# Patient Record
Sex: Female | Born: 1970 | Race: White | Hispanic: No | Marital: Married | Smoking: Never smoker
Health system: Southern US, Community
[De-identification: ages and names within clinical notes are randomized; demographics above are authoritative.]

## PROBLEM LIST (undated history)

## (undated) DIAGNOSIS — E785 Hyperlipidemia, unspecified: Secondary | ICD-10-CM

## (undated) DIAGNOSIS — C801 Malignant (primary) neoplasm, unspecified: Secondary | ICD-10-CM

## (undated) DIAGNOSIS — I10 Essential (primary) hypertension: Secondary | ICD-10-CM

## (undated) DIAGNOSIS — Z800 Family history of malignant neoplasm of digestive organs: Secondary | ICD-10-CM

## (undated) DIAGNOSIS — Z8051 Family history of malignant neoplasm of kidney: Secondary | ICD-10-CM

## (undated) DIAGNOSIS — Z8042 Family history of malignant neoplasm of prostate: Secondary | ICD-10-CM

## (undated) DIAGNOSIS — Z923 Personal history of irradiation: Secondary | ICD-10-CM

## (undated) DIAGNOSIS — K219 Gastro-esophageal reflux disease without esophagitis: Secondary | ICD-10-CM

## (undated) HISTORY — DX: Essential (primary) hypertension: I10

## (undated) HISTORY — DX: Family history of malignant neoplasm of digestive organs: Z80.0

## (undated) HISTORY — DX: Hyperlipidemia, unspecified: E78.5

## (undated) HISTORY — DX: Gastro-esophageal reflux disease without esophagitis: K21.9

## (undated) HISTORY — PX: BREAST SURGERY: SHX581

## (undated) HISTORY — DX: Family history of malignant neoplasm of prostate: Z80.42

## (undated) HISTORY — DX: Family history of malignant neoplasm of kidney: Z80.51

## (undated) HISTORY — DX: Malignant (primary) neoplasm, unspecified: C80.1

---

## 2010-11-29 DIAGNOSIS — C801 Malignant (primary) neoplasm, unspecified: Secondary | ICD-10-CM

## 2010-11-29 DIAGNOSIS — Z923 Personal history of irradiation: Secondary | ICD-10-CM

## 2010-11-29 HISTORY — DX: Malignant (primary) neoplasm, unspecified: C80.1

## 2010-11-29 HISTORY — DX: Personal history of irradiation: Z92.3

## 2011-08-04 ENCOUNTER — Other Ambulatory Visit: Payer: Self-pay | Admitting: Specialist

## 2011-08-04 DIAGNOSIS — R921 Mammographic calcification found on diagnostic imaging of breast: Secondary | ICD-10-CM

## 2011-08-11 ENCOUNTER — Ambulatory Visit
Admission: RE | Admit: 2011-08-11 | Discharge: 2011-08-11 | Disposition: A | Discharge status: Home / Self Care | Payer: BC Managed Care – PPO | Source: Ambulatory Visit | Attending: Specialist | Admitting: Specialist

## 2011-08-11 DIAGNOSIS — R921 Mammographic calcification found on diagnostic imaging of breast: Secondary | ICD-10-CM

## 2011-08-12 ENCOUNTER — Other Ambulatory Visit: Payer: Self-pay | Admitting: Specialist

## 2011-08-12 DIAGNOSIS — C50911 Malignant neoplasm of unspecified site of right female breast: Secondary | ICD-10-CM

## 2011-08-13 HISTORY — PX: BREAST BIOPSY: SHX20

## 2011-08-18 ENCOUNTER — Encounter (INDEPENDENT_AMBULATORY_CARE_PROVIDER_SITE_OTHER): Payer: Self-pay | Admitting: General Surgery

## 2011-08-18 ENCOUNTER — Ambulatory Visit (HOSPITAL_BASED_OUTPATIENT_CLINIC_OR_DEPARTMENT_OTHER): Payer: BC Managed Care – PPO | Admitting: General Surgery

## 2011-08-18 ENCOUNTER — Other Ambulatory Visit: Payer: Self-pay | Admitting: Oncology

## 2011-08-18 ENCOUNTER — Ambulatory Visit: Payer: BC Managed Care – PPO | Admitting: Physical Therapy

## 2011-08-18 ENCOUNTER — Encounter (HOSPITAL_BASED_OUTPATIENT_CLINIC_OR_DEPARTMENT_OTHER): Payer: BC Managed Care – PPO | Admitting: Oncology

## 2011-08-18 ENCOUNTER — Ambulatory Visit (INDEPENDENT_AMBULATORY_CARE_PROVIDER_SITE_OTHER): Payer: BC Managed Care – PPO | Admitting: General Surgery

## 2011-08-18 ENCOUNTER — Ambulatory Visit
Admission: RE | Admit: 2011-08-18 | Discharge: 2011-08-18 | Disposition: A | Discharge status: Home / Self Care | Payer: BC Managed Care – PPO | Source: Ambulatory Visit | Attending: Specialist | Admitting: Specialist

## 2011-08-18 ENCOUNTER — Encounter: Payer: BC Managed Care – PPO | Admitting: Oncology

## 2011-08-18 VITALS — BP 134/96 | HR 74 | Temp 98.6°F | Resp 20 | Ht 59.5 in | Wt 126.6 lb

## 2011-08-18 DIAGNOSIS — D0510 Intraductal carcinoma in situ of unspecified breast: Secondary | ICD-10-CM | POA: Insufficient documentation

## 2011-08-18 DIAGNOSIS — D0590 Unspecified type of carcinoma in situ of unspecified breast: Secondary | ICD-10-CM

## 2011-08-18 DIAGNOSIS — C50911 Malignant neoplasm of unspecified site of right female breast: Secondary | ICD-10-CM

## 2011-08-18 LAB — CBC WITH DIFFERENTIAL/PLATELET
Basophils Absolute: 0 10*3/uL (ref 0.0–0.1)
EOS%: 0.4 % (ref 0.0–7.0)
Eosinophils Absolute: 0 10*3/uL (ref 0.0–0.5)
HCT: 43 % (ref 34.8–46.6)
HGB: 14.5 g/dL (ref 11.6–15.9)
LYMPH%: 25.1 % (ref 14.0–49.7)
MCH: 30.7 pg (ref 25.1–34.0)
MCV: 90.9 fL (ref 79.5–101.0)
MONO%: 4.4 % (ref 0.0–14.0)
NEUT%: 70 % (ref 38.4–76.8)
Platelets: 235 10*3/uL (ref 145–400)

## 2011-08-18 LAB — COMPREHENSIVE METABOLIC PANEL
AST: 12 U/L (ref 0–37)
Alkaline Phosphatase: 63 U/L (ref 39–117)
BUN: 9 mg/dL (ref 6–23)
Creatinine, Ser: 0.8 mg/dL (ref 0.50–1.10)
Glucose, Bld: 96 mg/dL (ref 70–99)

## 2011-08-18 MED ORDER — GADOBENATE DIMEGLUMINE 529 MG/ML IV SOLN
12.0000 mL | Freq: Once | INTRAVENOUS | Status: AC | PRN
  Administered 2011-08-18: 12 mL via INTRAVENOUS

## 2011-08-18 NOTE — Patient Instructions (Signed)
IF YOU ARE TAKING ASPIRIN, COUMADIN/WARFARIN, PLAVIX, OR OTHER BLOOD THINNER, PLEASE LET US KNOW IMMEDIATELY.  WE WILL NEED TO DISCUSS WITH THE PRESCRIBING PROVIDER IF THESE ARE SAFE TO STOP. IF THESE ARE NOT STOPPED AT THE APPROPRIATE TIME, THIS WILL RESULT IN A DELAY FOR YOUR SURGERY.  DO NOT TAKE THESE MEDICATIONS OR IBUPROFEN/NAPROXEN WITHIN A WEEK BEFORE SURGERY.   The main risks of surgery are bleeding, infection, damage to other structures, and seroma (accumulation of fluid) under the incision site(s).    These complications may lead to additional procedures such as drainage of seroma/infection.  If cancer is found, you may need other surgeries to obtain negative margins or to take more lymph nodes.   Most women do accumulate fluid in the breast cavity where the specimen was removed. We do not always have to drain this fluid.  If your breast is very tense, painful, or red, then we may need to numb the skin and use a needle to aspirate the fluid.  We do provide patients with a Breast Binder.  The purpose of this is to avoid the use of tape on the sensitive tissue of the breast and to provide some compression to minimize the risk of seroma.  If the binder is uncomfortable, you may find that a tank top with a built-in shelf bra or a loose sports bra works better for you.  I recommend wearing this around the clock for the first 1-2 weeks except in the shower.    You may remove your dressings and may shower 48 hours after surgery IF YOU DO NOT HAVE A DRAIN.    Many patients have some constipation in the week after surgery due to the narcotics and anesthesia.  You may need over the counter stool softeners or laxatives if you experience difficulty having bowel movements.    If the following occur, call our office at [phone]: If you have a fever over 101 or pain that is severe despite narcotics. If you have redness or drainage at the wound. If you develop persistent nausea or vomiting.  I  will follow you back up in 1-4 weeks.    Please submit any paperwork about time off work/insurance forms to the front desk.   

## 2011-08-18 NOTE — Progress Notes (Signed)
Chief Complaint  Patient presents with  . Breast Cancer    HISTORY: Phyllis Hoffman is a 40 year old pt who presented with a screening detected breast mass on the R.  This is biopsy proven DCIS.  MRI is negative for other lesions.  She has no family history of breast cancer or ovarian cancer.  She has not ever had a breast biopsy. She has had irregular periods for many years until last year.  Onset of menses was age 70.  She cannot feel the abnormality.  She is quite anxious about the diagnosis.  She has had two normal mammograms since age 79.    Past Medical History  Diagnosis Date  . GERD (gastroesophageal reflux disease)   . Hypertension   . Hyperlipidemia     Past Surgical History  Procedure Date  . Cesarean section     Current Outpatient Prescriptions  Medication Sig Dispense Refill  . acidophilus (RISAQUAD) CAPS Take by mouth daily.        . fish oil-omega-3 fatty acids 1000 MG capsule Take 2 g by mouth daily.        Marland Kitchen glucosamine-chondroitin 500-400 MG tablet Take 1 tablet by mouth 3 (three) times daily.        . psyllium (REGULOID) 0.52 G capsule Take 0.52 g by mouth daily.         No current facility-administered medications for this visit.   Facility-Administered Medications Ordered in Other Visits  Medication Dose Route Frequency Provider Last Rate Last Dose  . gadobenate dimeglumine (MULTIHANCE) injection 12 mL  12 mL Intravenous Once PRN Medication Radiologist   12 mL at 08/18/11 0834     Allergies  Allergen Reactions  . Erythromycin   . Penicillins      Family History  Problem Relation Age of Onset  . Cancer Father     History   Social History  . Marital Status: Married    Spouse Name: N/A    Number of Children: N/A  . Years of Education: N/A   Occupational History  . works in [employer] trader    Social History Main Topics  . Smoking status: Never Smoker   . Smokeless tobacco: Not on file  . Alcohol Use: No  . Drug Use: Not on file  .  Sexually Active: Not on file   Other Topics Concern  . Not on file   Social History Narrative   Charles- spouseCaleb- 14 sonGrace 9 - daughter     REVIEW OF SYSTEMS - PERTINENT POSITIVES ONLY: 12 point review of systems negative other than HPI and PMH except for deliberate weigh loss, difficulty sleeping, and poor appetite.    EXAM: Filed Vitals:   08/18/11 1401  BP: 134/96  Pulse: 74  Temp: 98.6 F (37 C)  Resp: 20    Gen:  No acute distress.  Well nourished and well groomed.   Neurological: Alert and oriented to person, place, and time. Coordination normal.  Head: Normocephalic and atraumatic.  Eyes: Conjunctivae are normal. Pupils are equal, round, and reactive to light. No scleral icterus.  Neck: Normal range of motion. Neck supple. No tracheal deviation or thyromegaly present.  Cardiovascular: Normal rate, regular rhythm, normal heart sounds and intact distal pulses.  Exam reveals no gallop and no friction rub.  No murmur heard. Respiratory: Effort normal.  No respiratory distress. No chest wall tenderness. Breath sounds normal.  No wheezes, rales or rhonchi.  Breast:  Symmetric bilaterally.  No palpable masses or axillary  adenopathy.  Bruising over R lower inner quadrant of breast from biopsy.   GI: Soft. Bowel sounds are normal. The abdomen is soft and nontender.  There is no rebound and no guarding.  Musculoskeletal: Normal range of motion. Extremities are nontender.  Lymphadenopathy: No cervical, preauricular, postauricular or axillary adenopathy is present Skin: Skin is warm and dry. No rash noted. No diaphoresis. No erythema. No pallor. No clubbing, cyanosis, or edema.   Psychiatric: Normal mood and affect. Behavior is normal. Judgment and thought content normal.    LABORATORY RESULTS: Available labs are reviewed, cbc OK   RADIOLOGY RESULTS: See E-Chart for most recent results.  Images and reports are reviewed.  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 Would do lumpectomy with radiation in BRCA neg or if pt elects for breast conservation. Had long discussion regarding options. If pt elects to have genetic testing, she may elect to wait for results and have bilateral mastectomies with reconstruction.    We reviewed lumpectomy without lymph node biopsy should she have negative genetics or elect to proceed without testing.     Pt to proceed with lumpectomy now and plan any further intervention in the future.     Maudry Diego MD Surgical Oncology, General and Endocrine Surgery [facility]      Visit Diagnoses: 1. Ductal carcinoma in situ of breast   2. 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

## 2011-08-18 NOTE — Assessment & Plan Note (Addendum)
Plan genetic counseling Friday 9/21. Would do lumpectomy with radiation in BRCA neg or if pt elects for breast conservation. Had long discussion regarding options. If pt elects to have genetic testing, she may elect to wait for results and have bilateral mastectomies with reconstruction.    We reviewed lumpectomy without lymph node biopsy should she have negative genetics or elect to proceed without testing.

## 2011-08-19 ENCOUNTER — Other Ambulatory Visit (INDEPENDENT_AMBULATORY_CARE_PROVIDER_SITE_OTHER): Payer: Self-pay | Admitting: General Surgery

## 2011-08-19 DIAGNOSIS — C50919 Malignant neoplasm of unspecified site of unspecified female breast: Secondary | ICD-10-CM

## 2011-08-20 ENCOUNTER — Encounter: Payer: BC Managed Care – PPO | Admitting: Genetic Counselor

## 2011-08-24 ENCOUNTER — Encounter (HOSPITAL_COMMUNITY)
Admission: RE | Admit: 2011-08-24 | Discharge: 2011-08-24 | Disposition: A | Discharge status: Home / Self Care | Payer: BC Managed Care – PPO | Source: Ambulatory Visit | Attending: General Surgery | Admitting: General Surgery

## 2011-08-24 ENCOUNTER — Other Ambulatory Visit (INDEPENDENT_AMBULATORY_CARE_PROVIDER_SITE_OTHER): Payer: Self-pay | Admitting: General Surgery

## 2011-08-24 DIAGNOSIS — D0590 Unspecified type of carcinoma in situ of unspecified breast: Secondary | ICD-10-CM

## 2011-08-24 LAB — CBC
HCT: 40.7 % (ref 36.0–46.0)
Hemoglobin: 13.7 g/dL (ref 12.0–15.0)
MCH: 31 pg (ref 26.0–34.0)
MCHC: 33.7 g/dL (ref 30.0–36.0)
RBC: 4.42 MIL/uL (ref 3.87–5.11)

## 2011-08-24 LAB — BASIC METABOLIC PANEL
BUN: 10 mg/dL (ref 6–23)
CO2: 29 mEq/L (ref 19–32)
GFR calc non Af Amer: >60 mL/min (ref >60)
Glucose, Bld: 111 mg/dL — ABNORMAL HIGH (ref 70–99)
Potassium: 3.9 mEq/L (ref 3.5–5.1)
Sodium: 138 mEq/L (ref 135–145)

## 2011-08-24 LAB — SURGICAL PCR SCREEN
MRSA, PCR: NEGATIVE
Staphylococcus aureus: NEGATIVE

## 2011-08-25 ENCOUNTER — Ambulatory Visit
Admission: RE | Admit: 2011-08-25 | Discharge: 2011-08-25 | Disposition: A | Discharge status: Home / Self Care | Payer: BC Managed Care – PPO | Source: Ambulatory Visit | Attending: General Surgery | Admitting: General Surgery

## 2011-08-25 ENCOUNTER — Other Ambulatory Visit (INDEPENDENT_AMBULATORY_CARE_PROVIDER_SITE_OTHER): Payer: Self-pay | Admitting: General Surgery

## 2011-08-25 ENCOUNTER — Ambulatory Visit (HOSPITAL_COMMUNITY)
Admission: RE | Admit: 2011-08-25 | Discharge: 2011-08-25 | Disposition: A | Discharge status: Home / Self Care | Payer: BC Managed Care – PPO | Source: Ambulatory Visit | Attending: General Surgery | Admitting: General Surgery

## 2011-08-25 DIAGNOSIS — C50919 Malignant neoplasm of unspecified site of unspecified female breast: Secondary | ICD-10-CM

## 2011-08-25 DIAGNOSIS — Z01810 Encounter for preprocedural cardiovascular examination: Secondary | ICD-10-CM | POA: Insufficient documentation

## 2011-08-25 DIAGNOSIS — D0590 Unspecified type of carcinoma in situ of unspecified breast: Secondary | ICD-10-CM | POA: Insufficient documentation

## 2011-08-25 DIAGNOSIS — Z01818 Encounter for other preprocedural examination: Secondary | ICD-10-CM | POA: Insufficient documentation

## 2011-08-25 DIAGNOSIS — Z01812 Encounter for preprocedural laboratory examination: Secondary | ICD-10-CM | POA: Insufficient documentation

## 2011-08-25 HISTORY — PX: BREAST LUMPECTOMY: SHX2

## 2011-09-03 ENCOUNTER — Telehealth (INDEPENDENT_AMBULATORY_CARE_PROVIDER_SITE_OTHER): Payer: Self-pay | Admitting: General Surgery

## 2011-09-03 NOTE — Telephone Encounter (Signed)
Pt was advised that her medial margin was close and that the concensus of the multidisciplinary conference was to re-excise that margin. She is amenable and we will try to schedule next week.

## 2011-09-09 ENCOUNTER — Ambulatory Visit (HOSPITAL_BASED_OUTPATIENT_CLINIC_OR_DEPARTMENT_OTHER)
Admission: RE | Admit: 2011-09-09 | Discharge: 2011-09-09 | Disposition: A | Discharge status: Home / Self Care | Payer: BC Managed Care – PPO | Source: Ambulatory Visit | Attending: General Surgery | Admitting: General Surgery

## 2011-09-09 ENCOUNTER — Other Ambulatory Visit (INDEPENDENT_AMBULATORY_CARE_PROVIDER_SITE_OTHER): Payer: Self-pay | Admitting: General Surgery

## 2011-09-09 DIAGNOSIS — D0590 Unspecified type of carcinoma in situ of unspecified breast: Secondary | ICD-10-CM | POA: Insufficient documentation

## 2011-09-10 NOTE — Progress Notes (Signed)
Quick Note:  Please advise patient that re-excision margins are negative and that NO additional DCIS was found on the specimen. ______

## 2011-09-14 NOTE — Op Note (Signed)
  NAME:  REXANNA, LOUTHAN              ACCOUNT NO.:  [id number]  MEDICAL RECORD NO.:  [id number]  LOCATION:                                 FACILITY:  PHYSICIAN:  Almond Lint, MD       DATE OF BIRTH:  Jan 13, 1971  DATE OF PROCEDURE:  09/09/2011 DATE OF DISCHARGE:                              OPERATIVE REPORT   PREOPERATIVE DIAGNOSIS:  Right breast ductal carcinoma in situ.  POSTOPERATIVE DIAGNOSIS:  Right breast ductal carcinoma in situ.  PROCEDURE:  Re-excision of right breast medial margin for ductal carcinoma in situ.  SURGEON:  Almond Lint, MD  ASSISTANT:  None.  ANESTHESIA:  General and local.  FINDINGS:  Seroma cavity.  SPECIMEN:  Medial margin to pathology.  EBL:  Minimal.  COMPLICATIONS:  None known.  PROCEDURE:  Ms. Vastine was identified in the holding area and taken to the operating room, where she was placed supine on the operating room table.  General anesthesia was induced.  Her right breast was prepped and draped in sterile fashion.  Time-out was performed according to the surgical safety check list.  When all was correct, we continued.  The prior incision along the medial circumareolar border was opened back up. The seroma cavity drained out.  The medial margin was identified and elevated with 2 Allis clamps.  This was taken with the curved Mayos and disk around 5 mm stick and around the size of a 50 cent piece in diameter.  The specimen was marked short superior, long lateral.  The cavity was inspected for hemostasis which was achieved with the cautery. The breast was irrigated.  The skin was reapproximated with 3-0 Vicryl deep dermal sutures and 4-0 Monocryl running subcuticular sutures.  The wound was then cleaned, dried and dressed with Steri-Strips, gauze and a breast binder.  The patient was awakened from anesthesia and taken to the PACU in stable condition.  Needle, sponge, instrument counts were correct.     Almond Lint,  MD     FB/MEDQ  D:  09/09/2011  T:  09/09/2011  Job:  [id number]  Electronically Signed by Almond Lint MD on 09/14/2011 12:16:32 PM

## 2011-09-14 NOTE — Op Note (Signed)
  NAME:  AKSHITHA, CULMER NO.:  [id number]  MEDICAL RECORD NO.:  [id number]  LOCATION:  [facility]                         FACILITY:  [facility]  PHYSICIAN:  Almond Lint, MD       DATE OF BIRTH:  22-May-1971  DATE OF PROCEDURE:  08/25/2011 DATE OF DISCHARGE:                              OPERATIVE REPORT   PREOPERATIVE DIAGNOSIS:  Right breast ductal carcinoma in situ.  POSTOPERATIVE DIAGNOSIS:  Right breast ductal carcinoma in situ.  PROCEDURE:  Right breast needle-localized lumpectomy.  SURGEON:  Almond Lint, MD  ASSISTANT:  None.  ANESTHESIA:  General and local.  FINDINGS:  Clip in the specimen.  SPECIMEN:  Right breast lumpectomy to Pathology.  ESTIMATED BLOOD LOSS:  Minimal.  COMPLICATIONS:  None known.  PROCEDURE:  Ms. Villaflor was identified in the holding area and taken to operating room where she was placed supine on the operating room table. The wire mammograms were placed on the light blocks.  After her chestwas prepped and draped in sterile fashion, time-out was performed according to surgical safety check list.  When all was correct, we continued.   The wires were palpated under the breast and a circumareolar incision was made along the upper inner quadrant of the right breast. Skin hooks were used to elevate the skin and the cautery was used to take a flap on this side.  The wires were then identified with hemostats and pulled underneath the skin.  The breast tissue was elevated with 2 Allises.  The breast specimen was taken with a curved Mayo's.  One of the wires pulled out with retraction.  Once the specimen was completely excised, the breast cavity was packed with a laparotomy sponge.  The specimen was then marked on all sides of the margin marker paint kit. This was passed off the table and a specimen radiograph was performed demonstrating a clip and the wire was still in the specimen.  Tissue of interest was confirmed to be present with  Radiology.  The breast cavity was then examined and hemostasis achieved with cautery.  The clips were then placed on the medial, lateral, inferior, and superior.  Large clips were placed on the borders of the biopsy cavity.  Two large clips were placed posteriorly.  The wound was then closed with 3-0 Vicryl interrupted deep dermal sutures and 4-0 Monocryl running subcuticular sutures.  The wound was then cleaned, dried, and dressed with Steri- Strips, gauze, ABDs, and a breast binder.  The patient was awoke from anesthesia and taken to PACU in stable condition.  Needle, sponge, and instrument counts were correct x2.     Almond Lint, MD     FB/MEDQ  D:  08/25/2011  T:  08/25/2011  Job:  [id number]  Electronically Signed by Almond Lint MD on 09/14/2011 12:16:11 PM

## 2011-09-17 ENCOUNTER — Telehealth (INDEPENDENT_AMBULATORY_CARE_PROVIDER_SITE_OTHER): Payer: Self-pay

## 2011-09-17 NOTE — Telephone Encounter (Signed)
Pt called for her pathology results which I gave her.

## 2011-09-24 ENCOUNTER — Encounter (INDEPENDENT_AMBULATORY_CARE_PROVIDER_SITE_OTHER): Payer: Self-pay | Admitting: General Surgery

## 2011-09-24 ENCOUNTER — Ambulatory Visit (INDEPENDENT_AMBULATORY_CARE_PROVIDER_SITE_OTHER): Payer: BC Managed Care – PPO | Admitting: General Surgery

## 2011-09-24 VITALS — BP 128/64 | HR 68 | Temp 97.4°F | Resp 14 | Ht 59.5 in | Wt 124.0 lb

## 2011-09-24 DIAGNOSIS — D0590 Unspecified type of carcinoma in situ of unspecified breast: Secondary | ICD-10-CM

## 2011-09-24 DIAGNOSIS — D0510 Intraductal carcinoma in situ of unspecified breast: Secondary | ICD-10-CM

## 2011-09-24 NOTE — Assessment & Plan Note (Signed)
S/p re-excision. Margins widely negative. Pain resolved. Small hematoma should soften up. Follow up with me in 6 months. Has appointments with Dr. Darnelle Catalan and Dr. Mitzi Hansen scheduled.

## 2011-09-24 NOTE — Progress Notes (Signed)
HISTORY: Pt with more soreness initially after re-excision.  Now improved.  She is back at work.  She is having radiation in [place].  She denies other problems.     EXAM: Head: Normocephalic and atraumatic.  Eyes:  Conjunctivae are normal. Pupils are equal, round, and reactive to light. No scleral icterus.  Neck:  Normal range of motion. Neck supple. No tracheal deviation present. No thyromegaly present.  Resp: No respiratory distress, normal effort. Abd:  Abdomen is soft, non distended and non tender. No masses are palpable.  There is no rebound and no guarding.  Neurological: Alert and oriented to person, place, and time. Coordination normal.  Skin: Skin is warm and dry. No rash noted. No diaphoretic. No erythema. No pallor.  Psychiatric: Normal mood and affect. Normal behavior. Judgment and thought content normal.      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 Margins widely negative. Pain resolved. Small hematoma should soften up. Follow up with me in 6 months. Has appointments with Dr. Darnelle Catalan and Dr. Mitzi Hansen scheduled.      Maudry Diego, MD Surgical Oncology, General & Endocrine Surgery [facility]  Jeannett Senior, MD Jeannett Senior, MD

## 2011-09-24 NOTE — Patient Instructions (Signed)
Call if any concerns arise.  

## 2011-09-28 ENCOUNTER — Encounter (INDEPENDENT_AMBULATORY_CARE_PROVIDER_SITE_OTHER): Payer: Self-pay | Admitting: General Surgery

## 2011-10-04 ENCOUNTER — Telehealth (INDEPENDENT_AMBULATORY_CARE_PROVIDER_SITE_OTHER): Payer: Self-pay | Admitting: General Surgery

## 2011-10-04 NOTE — Telephone Encounter (Signed)
Rec'd call from Ty Hilts at [facility]) requested notes from re-excision of right breast. Fax # [phone]. Call back number [phone] option #6 to reach Clydie Braun directly.

## 2011-11-10 ENCOUNTER — Ambulatory Visit (HOSPITAL_BASED_OUTPATIENT_CLINIC_OR_DEPARTMENT_OTHER): Payer: BC Managed Care – PPO | Admitting: Oncology

## 2011-11-10 ENCOUNTER — Other Ambulatory Visit: Payer: Self-pay | Admitting: Oncology

## 2011-11-10 ENCOUNTER — Other Ambulatory Visit (HOSPITAL_BASED_OUTPATIENT_CLINIC_OR_DEPARTMENT_OTHER): Payer: BC Managed Care – PPO | Admitting: Lab

## 2011-11-10 ENCOUNTER — Telehealth: Payer: Self-pay | Admitting: *Deleted

## 2011-11-10 ENCOUNTER — Encounter: Payer: Self-pay | Admitting: Oncology

## 2011-11-10 VITALS — BP 134/89 | HR 98 | Temp 98.3°F | Ht 59.5 in | Wt 125.8 lb

## 2011-11-10 DIAGNOSIS — D0590 Unspecified type of carcinoma in situ of unspecified breast: Secondary | ICD-10-CM

## 2011-11-10 DIAGNOSIS — C50919 Malignant neoplasm of unspecified site of unspecified female breast: Secondary | ICD-10-CM

## 2011-11-10 LAB — CBC WITH DIFFERENTIAL/PLATELET
Basophils Absolute: 0 10*3/uL (ref 0.0–0.1)
Eosinophils Absolute: 0 10*3/uL (ref 0.0–0.5)
HGB: 14.1 g/dL (ref 11.6–15.9)
MCV: 92.5 fL (ref 79.5–101.0)
MONO#: 0.3 10*3/uL (ref 0.1–0.9)
NEUT#: 4.7 10*3/uL (ref 1.5–6.5)
RBC: 4.49 10*6/uL (ref 3.70–5.45)
RDW: 12.8 % (ref 11.2–14.5)
WBC: 6.1 10*3/uL (ref 3.9–10.3)
lymph#: 1 10*3/uL (ref 0.9–3.3)

## 2011-11-10 MED ORDER — TAMOXIFEN CITRATE 20 MG PO TABS: 20.0000 mg | ORAL_TABLET | Freq: Every day | ORAL | Status: DC

## 2011-11-10 NOTE — Telephone Encounter (Signed)
gave patient appointment for 12-29-2011 starting at 10:00am printed out calendar and gave to the patient

## 2011-11-10 NOTE — Patient Instructions (Signed)
Begin tamoxifen, 20 mg once daily, with 1st dose on the day after you complete radiation therapy.  Return in approx. 6 weeks for follow up visit.  Call with any questions [phone]

## 2011-11-10 NOTE — Progress Notes (Signed)
Hematology and Oncology Follow Up Visit  Phyllis Hoffman [id number] 10-27-1971 40 y.o. 11/10/2011    HPI: Phyllis Hoffman is a [place] woman, referred to [place] in September 2012 at the age of 33 for evaluation of right breast carcinoma.  Bilateral digital mammogram in [place] in August of 2011 showed a focus of microcalcification in the right breast and 6 month followup was recommended. This was performed in February 2012, the calcifications appearing stable as compared to prior study. The patient was recalled for repeat mammography in 6 months to resume her yearly schedule. On 07/29/2011, the calcifications were again noted, some of them appearing more irregular, however, and with previous studies. Left breast was unremarkable.  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 Tumor was ER and PR positive, both at 100%.  Bilateral breast MRIs were obtained 08/18/2011 showing a post biopsy hematoma in the medial right breast. There was a vague patchy no masslike enhancement in the medial right breast near the biopsy cavity, spanning approximately 2.4 cm. No evidence of multifocal or multicentric disease in the right breast. Left breast and lymph nodes appeared benign.  Patient did underwent reexcision of the right medial margin but subsequent clear margins. Final pathology [id number]) showed a high-grade ductal carcinoma in situ with necrosis and calcification.  Patient underwent radiation therapy in [place], and is scheduled to complete radiation next week. The plan is to treat with tamoxifen, 20 mg daily, for 5 years.  Of note patient is known to be BRCA1 and 2 negative.  Interim History:   Patient returns today for followup of her right breast carcinoma. She has almost completed radiation to the right breast, her last therapy scheduled for next week. She is here to discuss  beginning on tamoxifen. Over half of our 45 minute appointment today was spent discussing the benefits as well as the risks and toxicities associated with tamoxifen.  Overall patient is doing well. She did have multiple questions today. She's tolerated radiation well, with only the expected skin changes. Her energy level is fairly good. She continues to work in [employer]. She continues to have regular menstrual cycles. We did discuss the fact that she would not want it pregnant on the tamoxifen, and of course her husband is status post mastectomy.  A detailed review of systems is otherwise noncontributory as noted below.  Review of Systems: Constitutional:  no weight loss, fever, night sweats and feels well Eyes: negative [id number] Cardiovascular: no chest pain or dyspnea on exertion Respiratory: no cough, shortness of breath, or wheezing Neurological: negative Dermatological: negative Gastrointestinal: no abdominal pain, change in bowel habits, or black or bloody stools Genito-Urinary: no dysuria, trouble voiding, or hematuria Hematological and Lymphatic: negative Breast: positive for - skin changes on right associated with radiation Musculoskeletal: negative Remaining ROS negative.  Family History: Patient's father is alive at age 37, her mother alive at age 62. Her father had prostate cancer diagnosed proximally 7 years ago, had surgery, and his spine. The patient has one brother and no sisters. No breast or ovarian cancer in the family to the best of her knowledge.  Gynecologic History: First menstrual period at age 34, still having regular periods. Patient is GX P2,  first pregnancy to term at age 31. She did use birth control pills for approximately 17 years.  Social History: Patient works in a Futures trader. Her husband Phyllis Hoffman "[employer], trains showing horses  and does quite a bit of traveling. They have 2 children, 1 son, Phyllis Hoffman, age 68 and one daughter,  Phyllis Hoffman, age 71. The patient attends the [employer] in [place].  Medications: I have reviewed the patient's current medications. Current Outpatient Prescriptions  Medication Sig Dispense Refill  . Ascorbic Acid (VITAMIN C) 1000 MG tablet Take 1,000 mg by mouth 3 (three) times daily.        . fish oil-omega-3 fatty acids 1000 MG capsule Take 1 g by mouth daily.       Marland Kitchen glucosamine-chondroitin 500-400 MG tablet Take 1 tablet by mouth 3 (three) times daily.        . Multiple Vitamin (MULTIVITAMIN) tablet Take 1 tablet by mouth daily.        Marland Kitchen OVER THE COUNTER MEDICATION Take 1 tablet by mouth 3 (three) times daily. Immune system builder       . psyllium (REGULOID) 0.52 G capsule Take 0.52 g by mouth daily.        Marland Kitchen acidophilus (RISAQUAD) CAPS Take by mouth daily.        . tamoxifen (NOLVADEX) 20 MG tablet Take 1 tablet (20 mg total) by mouth daily.  30 tablet  6    Allergies:  Allergies  Allergen Reactions  . Erythromycin Nausea And Vomiting  . Penicillins Other (See Comments)    Happened when she was a child, does not remember reaction.    Physical Exam: Filed Vitals:   11/10/11 1037  BP: 134/89  Pulse: 98  Temp: 98.3 F (36.8 C)   HEENT:  Sclerae anicteric, conjunctivae pink.  Oropharynx clear.  No mucositis or candidiasis.   Nodes:  No cervical, supraclavicular, or axillary lymphadenopathy palpated.  Breast Exam:   right breast, status post lumpectomy with well-healed incision. There is erythema, hyperpigmentation, and skin changes associated with radiation. No evidence of local recurrence. Left breast is benign, no masses, discharge, skin change, or nipple inversion. Lungs:  Clear to auscultation bilaterally.  No crackles, rhonchi, or wheezes.   Heart:  Regular rate and rhythm.   Abdomen:  Soft, nontender.  Positive bowel sounds.  No organomegaly or masses palpated.   Musculoskeletal:  No focal spinal tenderness to palpation.  Extremities:  Benign.  No peripheral  edema or cyanosis.   Skin:  Benign.   Neuro:  Nonfocal.   Lab Results: Lab Results  Component Value Date   WBC 6.1 11/10/2011   HGB 14.1 11/10/2011   HCT 41.6 11/10/2011   MCV 92.5 11/10/2011   PLT 208 11/10/2011   NEUTROABS 4.7 11/10/2011     Chemistry      Component Value Date/Time   NA 138 08/24/2011 1405   K 3.9 08/24/2011 1405   CL 101 08/24/2011 1405   CO2 29 08/24/2011 1405   BUN 10 08/24/2011 1405   CREATININE 0.61 08/24/2011 1405      Component Value Date/Time   CALCIUM 9.5 08/24/2011 1405   ALKPHOS 63 08/18/2011 1200   ALKPHOS 63 08/18/2011 1200   AST 12 08/18/2011 1200   AST 12 08/18/2011 1200   ALT 11 08/18/2011 1200   ALT 11 08/18/2011 1200   BILITOT 0.2* 08/18/2011 1200   BILITOT 0.2* 08/18/2011 1200        Radiological Studies:  No results found.   Assessment:  40 year old [place], status post right breast biopsies on 08/11/2011 for ductal carcinoma in situ, intermediate to high grade, strongly ER and PR positive. Underwent subsequent reexcision of the right  medial margin in October 2012. Currently receiving radiation therapy in  [place], and scheduled to complete treatment next week. Ready to initiate tamoxifen, the plan being to continue for total of 5 years, then discontinued followup. Patient is known to be BRCA1 and 2 negative.   Plan:  Overall patient is doing well, and is tolerating radiation with no complications. She'll complete radiation next week, and will begin tamoxifen the following day at 20 mg daily. We did spend over half of our 45 minute appointment today reviewing the benefits as well as the possible toxicities and side effects associated with tamoxifen,, and she was given her prescription today. She does understand to begin this after finishing radiation.  We'll plan on seeing the patient back and proximally 6-8 weeks to assess tolerance. At that point we'll discuss her long-term followup.    This plan was reviewed with the patient, who  voices understanding and agreement.  She knows to call with any changes or problems.    Iviona Hole, PA-C 11/10/2011

## 2011-12-01 ENCOUNTER — Telehealth (INDEPENDENT_AMBULATORY_CARE_PROVIDER_SITE_OTHER): Payer: Self-pay

## 2011-12-01 ENCOUNTER — Telehealth: Payer: Self-pay | Admitting: *Deleted

## 2011-12-01 ENCOUNTER — Encounter (INDEPENDENT_AMBULATORY_CARE_PROVIDER_SITE_OTHER): Payer: Self-pay

## 2011-12-01 NOTE — Telephone Encounter (Signed)
Pt called to request a letter excusing her from upcoming jury duty.  I told her I would ask Dr. Donell Beers and call her back today.

## 2011-12-01 NOTE — Telephone Encounter (Signed)
Message left

## 2011-12-01 NOTE — Telephone Encounter (Signed)
Left message that letter to excuse pt from jury duty would be mailed to her.

## 2011-12-29 ENCOUNTER — Other Ambulatory Visit: Payer: BC Managed Care – PPO | Admitting: Lab

## 2011-12-29 ENCOUNTER — Other Ambulatory Visit: Payer: Self-pay | Admitting: Oncology

## 2011-12-29 ENCOUNTER — Ambulatory Visit (HOSPITAL_BASED_OUTPATIENT_CLINIC_OR_DEPARTMENT_OTHER): Payer: BC Managed Care – PPO | Admitting: Oncology

## 2011-12-29 ENCOUNTER — Telehealth: Payer: Self-pay | Admitting: Oncology

## 2011-12-29 VITALS — BP 126/89 | HR 85 | Temp 98.6°F | Ht 59.5 in | Wt 132.8 lb

## 2011-12-29 DIAGNOSIS — C50919 Malignant neoplasm of unspecified site of unspecified female breast: Secondary | ICD-10-CM

## 2011-12-29 DIAGNOSIS — D0510 Intraductal carcinoma in situ of unspecified breast: Secondary | ICD-10-CM

## 2011-12-29 LAB — CBC WITH DIFFERENTIAL/PLATELET
BASO%: 0.4 % (ref 0.0–2.0)
Basophils Absolute: 0 10*3/uL (ref 0.0–0.1)
Eosinophils Absolute: 0 10*3/uL (ref 0.0–0.5)
HCT: 41 % (ref 34.8–46.6)
HGB: 14.1 g/dL (ref 11.6–15.9)
LYMPH%: 25 % (ref 14.0–49.7)
MCHC: 34.3 g/dL (ref 31.5–36.0)
MONO#: 0.2 10*3/uL (ref 0.1–0.9)
NEUT#: 3 10*3/uL (ref 1.5–6.5)
NEUT%: 69.7 % (ref 38.4–76.8)
Platelets: 191 10*3/uL (ref 145–400)
WBC: 4.3 10*3/uL (ref 3.9–10.3)
lymph#: 1.1 10*3/uL (ref 0.9–3.3)

## 2011-12-29 LAB — COMPREHENSIVE METABOLIC PANEL
ALT: 16 U/L (ref 0–35)
CO2: 28 mEq/L (ref 19–32)
Calcium: 9.4 mg/dL (ref 8.4–10.5)
Chloride: 104 mEq/L (ref 96–112)
Creatinine, Ser: 0.73 mg/dL (ref 0.50–1.10)
Glucose, Bld: 89 mg/dL (ref 70–99)

## 2011-12-29 LAB — CANCER ANTIGEN 27.29: CA 27.29: 18 U/mL (ref 0–39)

## 2011-12-29 NOTE — Progress Notes (Signed)
Hematology and Oncology Follow Up Visit  Phyllis Hoffman [id number] 12-Feb-1971 41 y.o. 12/29/2011    HPI: Phyllis Hoffman is a [place] woman, referred to [place] in September 2012 at the age of 26 for evaluation of right breast carcinoma.  Bilateral digital mammogram in [place] in August of 2011 showed a focus of microcalcification in the right breast and 6 month followup was recommended. This was performed in February 2012, the calcifications appearing stable as compared to prior study. The patient was recalled for repeat mammography in 6 months to resume her yearly schedule. On 07/29/2011, the calcifications were again noted, some of them appearing more irregular, however, and with previous studies. Left breast was unremarkable.  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 Tumor was ER and PR positive, both at 100%.  Bilateral breast MRIs were obtained 08/18/2011 showing a post biopsy hematoma in the medial right breast. There was a vague patchy no masslike enhancement in the medial right breast near the biopsy cavity, spanning approximately 2.4 cm. No evidence of multifocal or multicentric disease in the right breast. Left breast and lymph nodes appeared benign.  Patient did underwent reexcision of the right medial margin but subsequent clear margins. 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  Patient underwent radiation therapy in [place],completed December 2012.At that time she started tamoxifen. Of note patient is known to be BRCA1 and 2 negative.  Interim History:   Phyllis Hoffman returns with her husband Chip for followup of her breast cancer. She did very well with her radiation, working full-time right through the treatments. She did have some dry desquamation, which she treated with Aquaphor. She started tamoxifen  11/20/2011. Incidentally Chip prepared a horse show for the governors in operation earlier this month. He says he went very well.  Review of Systems: She is not having significant hot flashes and in fact she is having regular periods, the most recent one January 19. She has gained a little weight however from 125 to her current 132. She's exercising 6-7 days a week and eats "a good diet" --this was discussed extensively today. A detailed review of systems is otherwise entirely negative.  Family History: Patient's father is alive at age 17, her mother alive at age 19. Her father had prostate cancer diagnosed proximally 7 years ago, had surgery, and "is fine". The patient has one brother and no sisters. No breast or ovarian cancer in the family to the best of her knowledge.  Gynecologic History: First menstrual period at age 73, still having regular periods. Patient is GX P2,  first pregnancy to term at age 42. She did use birth control pills for approximately 17 years. Most recent menstrual period earlier this month as noted above.  Social History: Patient works in a Futures trader. Her husband Leonette Most "[employer], trains showing horses and does quite a bit of traveling. They have 2 children, 1 son, Wilber Oliphant, age 7 and one daughter, Delorise Shiner, age 33. The patient attends the [facility] [facility] [employer] in [place].  Medications: I have reviewed the patient's current medications. Current Outpatient Prescriptions  Medication Sig Dispense Refill  . acidophilus (RISAQUAD) CAPS Take by mouth daily.        . Ascorbic Acid (VITAMIN C) 1000 MG tablet Take 1,000 mg by mouth 3 (three) times daily.        . fish oil-omega-3 fatty acids 1000 MG  capsule Take 1 g by mouth daily.       Marland Kitchen glucosamine-chondroitin 500-400 MG tablet Take 1 tablet by mouth 3 (three) times daily.        . Multiple Vitamin (MULTIVITAMIN) tablet Take 1 tablet by mouth daily.        Marland Kitchen OVER THE COUNTER MEDICATION Take 1 tablet by mouth 3  (three) times daily. Immune system builder       . psyllium (REGULOID) 0.52 G capsule Take 0.52 g by mouth daily.         tamoxifen 20 mg by mouth daily  Allergies:  Allergies  Allergen Reactions  . Erythromycin Nausea And Vomiting  . Penicillins Other (See Comments)    Happened when she was a child, does not remember reaction.    Physical Exam: Filed Vitals:   12/29/11 1020  BP: 126/89  Pulse: 85  Temp: 98.6 F (37 C)   HEENT:  Sclerae anicteric, conjunctivae pink.  Oropharynx clear.  No mucositis or candidiasis.   Nodes:  No cervical, supraclavicular, or axillary lymphadenopathy palpated.  Breast Exam:   right breast, status post lumpectomy with well-healed incision. There is minimal hyperpigmentation. There is some induration as expected beneath the scar. There are no other skin changes and no nipple retraction. Left breast is unremarkable  Lungs:  Clear to auscultation bilaterally.  No crackles, rhonchi, or wheezes.   Heart:  Regular rate and rhythm.   Abdomen:  Soft, nontender.  Positive bowel sounds.  No organomegaly or masses palpated.   Musculoskeletal:  No focal spinal tenderness to palpation.  Extremities:  Benign.  No peripheral edema or cyanosis.   Skin:  Benign.   Neuro:  Nonfocal.   Lab Results: Lab Results  Component Value Date   WBC 4.3 12/29/2011   HGB 14.1 12/29/2011   HCT 41.0 12/29/2011   MCV 92.2 12/29/2011   PLT 191 12/29/2011   NEUTROABS 3.0 12/29/2011     Chemistry      Component Value Date/Time   NA 138 08/24/2011 1405   K 3.9 08/24/2011 1405   CL 101 08/24/2011 1405   CO2 29 08/24/2011 1405   BUN 10 08/24/2011 1405   CREATININE 0.61 08/24/2011 1405      Component Value Date/Time   CALCIUM 9.5 08/24/2011 1405   ALKPHOS 63 08/18/2011 1200   ALKPHOS 63 08/18/2011 1200   AST 12 08/18/2011 1200   AST 12 08/18/2011 1200   ALT 11 08/18/2011 1200   ALT 11 08/18/2011 1200   BILITOT 0.2* 08/18/2011 1200   BILITOT 0.2* 08/18/2011 1200        Radiological  Studies:  No new results found. She will be due for mammography late September/early October.   Assessment:  41 year old [place], [place] woman, BRCA 1-2 negative, status post right breast biopsy on 08/11/2011 for ductal carcinoma in situ, intermediate to high grade, strongly estrogen receptor and progesterone positive, with  subsequent reexcision for margin clearance October 2012.; s/p radiation therapy delivered in  [place] completed December 2012, at which time she started tamoxifen.  Plan:  She is tolerating tamoxifen well. She understands this as a preventive as much as the treatment, and that if it affects quality of life in a negative way of we certainly can discontinue it. At this point the plan is to continue tamoxifen for a total of 5 years. She will let us know if she has any other side effects of concern.  As far as weight gain is concerned, we have  good randomized trials of tamoxifen versus placebo showing that both the tamoxifen and placebo group's gain weight (woman in sig 02 change of life tend to increase other way) but the tamoxifen group did not gain more weight than the placebo group. She is doing terrific as far as exercise is concerned. As far as diet is concerned my recommendation is to cut back on the starches, avoid fried foods, and need as as many vegetables as Splenda fully as she wants.  She is going to have her next mammogram at the breast center October 29 year. She will see me the same day. I plan to see her in a once a year basis until she completes her 5 years of followup. I am not planning to do any lab work since she gets that through her internist in [place] and she sees her gynecologist in June and her internist in February, I think that would be the optimal way to space out her physicians. In any case she knows to call for any problems that may develop before the next visit.  Raheem Kolbe C, PA-C 12/29/2011

## 2011-12-29 NOTE — Telephone Encounter (Signed)
gve the pt her oct 2013 appt calendar along with the mammo appt °

## 2012-03-13 ENCOUNTER — Encounter (INDEPENDENT_AMBULATORY_CARE_PROVIDER_SITE_OTHER): Payer: Self-pay | Admitting: General Surgery

## 2012-03-13 ENCOUNTER — Ambulatory Visit (INDEPENDENT_AMBULATORY_CARE_PROVIDER_SITE_OTHER): Payer: BC Managed Care – PPO | Admitting: General Surgery

## 2012-03-13 VITALS — BP 120/75 | HR 70 | Temp 97.5°F | Resp 18 | Ht 59.5 in | Wt 131.0 lb

## 2012-03-13 DIAGNOSIS — D0590 Unspecified type of carcinoma in situ of unspecified breast: Secondary | ICD-10-CM

## 2012-03-13 DIAGNOSIS — D0510 Intraductal carcinoma in situ of unspecified breast: Secondary | ICD-10-CM

## 2012-03-13 NOTE — Patient Instructions (Signed)
Call if right nipple lesion changes.  Otherwise follow up in 6 months.

## 2012-03-13 NOTE — Progress Notes (Signed)
HISTORY: The patient has been doing well status post right needle localized lumpectomy for DCIS. She is 6 months out. She does complain of skin issues like itching and dryness of her nipple. She also has some tightness in her right axilla when she is lying on her right side. She has not had any issues with tamoxifen. She does complain of a small spot just to the right of her nipple on her areola where it looks like there is ink present. She did not receive any tattoo in this location. She does not have any palpable breast masses.   PERTINENT REVIEW OF SYSTEMS: Otherwise negative.     EXAM: Head: Normocephalic and atraumatic.  Eyes:  Conjunctivae are normal. Pupils are equal, round, and reactive to light. No scleral icterus.  Neck:  Normal range of motion. Neck supple. No tracheal deviation present. No thyromegaly present.  Resp: No respiratory distress, normal effort. Breast:  No masses, skin dimpling, or lymphadenopathy.  Scar is well healed.  There is a slight elevation of the right breast.  The breast size is symmetric.  There is a 2 mm black area that looks like marker ink on the r nipple at the border of the nipple and areola.   Abd:  Abdomen is soft, non distended and non tender. No masses are palpable.  There is no rebound and no guarding.  Neurological: Alert and oriented to person, place, and time. Coordination normal.  Skin: Skin is warm and dry. No rash noted. No diaphoretic. No erythema. No pallor.  Psychiatric: Normal mood and affect. Normal behavior. Judgment and thought content normal.      ASSESSMENT AND PLAN:   DCIS (ductal carcinoma in situ), R breast lower inner quadrant 1 cm, +/+ HR No clinical evidence of disease.   Scheduled for follow up mammogram. Keep eye on nipple lesion.    Follow up with me in 6 months.         Maudry Diego, MD Surgical Oncology, General & Endocrine Surgery [facility]  Jeannett Senior, MD, MD Jeannett Senior,  MD

## 2012-03-13 NOTE — Assessment & Plan Note (Signed)
No clinical evidence of disease.   Scheduled for follow up mammogram. Keep eye on nipple lesion.    Follow up with me in 6 months.

## 2012-06-05 ENCOUNTER — Other Ambulatory Visit: Payer: Self-pay | Admitting: Physician Assistant

## 2012-06-05 DIAGNOSIS — C50219 Malignant neoplasm of upper-inner quadrant of unspecified female breast: Secondary | ICD-10-CM

## 2012-09-26 ENCOUNTER — Telehealth: Payer: Self-pay | Admitting: *Deleted

## 2012-09-26 ENCOUNTER — Ambulatory Visit (HOSPITAL_BASED_OUTPATIENT_CLINIC_OR_DEPARTMENT_OTHER): Payer: BC Managed Care – PPO | Admitting: Oncology

## 2012-09-26 ENCOUNTER — Ambulatory Visit
Admission: RE | Admit: 2012-09-26 | Discharge: 2012-09-26 | Disposition: A | Discharge status: Home / Self Care | Payer: BC Managed Care – PPO | Source: Ambulatory Visit | Attending: Oncology | Admitting: Oncology

## 2012-09-26 ENCOUNTER — Other Ambulatory Visit: Payer: Self-pay | Admitting: Oncology

## 2012-09-26 VITALS — BP 128/89 | HR 79 | Temp 98.3°F | Resp 20 | Ht 59.5 in | Wt 125.8 lb

## 2012-09-26 DIAGNOSIS — Z170 Estrogen receptor positive status [ER+]: Secondary | ICD-10-CM

## 2012-09-26 DIAGNOSIS — D0510 Intraductal carcinoma in situ of unspecified breast: Secondary | ICD-10-CM

## 2012-09-26 DIAGNOSIS — D0590 Unspecified type of carcinoma in situ of unspecified breast: Secondary | ICD-10-CM

## 2012-09-26 MED ORDER — TAMOXIFEN CITRATE 20 MG PO TABS: 20.0000 mg | ORAL_TABLET | Freq: Every day | ORAL | Status: DC

## 2012-09-26 NOTE — Progress Notes (Signed)
Hematology and Oncology Follow Up Visit  Phyllis Hoffman [id number] Oct 23, 1971 41 y.o. 09/26/2012   PCP: Jeannett Senior, MD    HPI: Phyllis Hoffman is a [place] woman, referred to [place] in September 2012 at the age of 29 for evaluation of right breast carcinoma.  Bilateral digital mammogram in [place] in August of 2011 showed a focus of microcalcification in the right breast and 6 month followup was recommended. This was performed in February 2012, the calcifications appearing stable as compared to prior study. The patient was recalled for repeat mammography in 6 months to resume her yearly schedule. On 07/29/2011, the calcifications were again noted, some of them appearing more irregular, however, and with previous studies. Left breast was unremarkable.  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 Tumor was ER and PR positive, both at 100%.  Bilateral breast MRIs were obtained 08/18/2011 showing a post biopsy hematoma in the medial right breast. There was a vague patchy no masslike enhancement in the medial right breast near the biopsy cavity, spanning approximately 2.4 cm. No evidence of multifocal or multicentric disease in the right breast. Left breast and lymph nodes appeared benign.  Patient did underwent reexcision of the right medial margin but subsequent clear margins. 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  Patient underwent radiation therapy in [place],completed December 2012.At that time she started tamoxifen. Of note patient is known to be BRCA1 and 2 negative.  Interim History:   Phyllis Hoffman returns with her husband Chip for followup of her breast cancer. Since her last visit here she started an exercise program with weight training and aerobic since well as walking. She also started a diet consisting  largely of vegetables and fruits. As a result her weight is back to baseline, and in fact slightly below her original baseline. She is deservedly proud of this  Review of Systems: She does not have hot flashes every day. When she does achieve flushes and sweats a little. She is having regular periods, last name approximately 4 days, with only 1 of heavy. Otherwise a detailed review of systems today was entirely negative.  Family History: Patient's father is alive at age 46, her mother alive at age 56. Her father had prostate cancer diagnosed proximally 7 years ago, had surgery, and "is fine". The patient has one brother and no sisters. No breast or ovarian cancer in the family to the best of her knowledge.  Gynecologic History: First menstrual period at age 73, still having regular periods. Patient is GX P2,  first pregnancy to term at age 53. She did use birth control pills for approximately 17 years. Most recent menstrual period earlier this month as noted above.  Social History: Patient works in her Manufacturing engineer. Her husband Leonette Most "[employer], trains showing horses and does quite a bit of traveling. They have 2 children, 1 son, Wilber Oliphant, age 88 and one daughter, Delorise Shiner, age 39. The patient attends the [facility] in [place].  Medications: I have reviewed the patient's current medications. Current Outpatient Prescriptions  Medication Sig Dispense Refill  . acidophilus (RISAQUAD) CAPS Take by mouth daily.        . Ascorbic Acid (VITAMIN C) 1000 MG tablet Take 1,000 mg by mouth 3 (three) times daily.        . fish oil-omega-3 fatty acids 1000 MG capsule Take 1 g by mouth  daily.       . glucosamine-chondroitin 500-400 MG tablet Take 1 tablet by mouth 3 (three) times daily.        . Multiple Vitamin (MULTIVITAMIN) tablet Take 1 tablet by mouth daily.        Marland Kitchen OVER THE COUNTER MEDICATION Take 1 tablet by mouth 3 (three) times daily. Immune system builder       . psyllium  (REGULOID) 0.52 G capsule Take 0.52 g by mouth daily.        . tamoxifen (NOLVADEX) 20 MG tablet TAKE 1 TABLET BY MOUTH EVERY DAY  30 tablet  3    Allergies:  Allergies  Allergen Reactions  . Erythromycin Nausea And Vomiting  . Penicillins Other (See Comments)    Happened when she was a child, does not remember reaction.    Physical Exam: Young white woman who looks well  Filed Vitals:   09/26/12 1214  BP: 128/89  Pulse: 79  Temp: 98.3 F (36.8 C)  Resp: 20   Sclerae unicteric Oropharynx clear No cervical or supraclavicular adenopathy Lungs no rales or rhonchi Heart regular rate and rhythm Abd benign MSK no focal spinal tenderness, no peripheral edema Neuro: nonfocal Breasts: The right breast is status post lumpectomy and radiation. There is no evidence of local recurrence. The right axilla is benign. The left breast is unremarkable    Lab Results: Lab Results  Component Value Date   WBC 4.3 12/29/2011   HGB 14.1 12/29/2011   HCT 41.0 12/29/2011   MCV 92.2 12/29/2011   PLT 191 12/29/2011   NEUTROABS 3.0 12/29/2011     Chemistry      Component Value Date/Time   NA 140 12/29/2011 1002   K 3.9 12/29/2011 1002   CL 104 12/29/2011 1002   CO2 28 12/29/2011 1002   BUN 11 12/29/2011 1002   CREATININE 0.73 12/29/2011 1002      Component Value Date/Time   CALCIUM 9.4 12/29/2011 1002   ALKPHOS 50 12/29/2011 1002   AST 16 12/29/2011 1002   ALT 16 12/29/2011 1002   BILITOT 0.2* 12/29/2011 1002        Radiological Studies: Mm Digital Diagnostic Bilat  09/26/2012  *RADIOLOGY REPORT*  Clinical Data:  Malignant lumpectomy of the right breast in September, 2012, with subsequent radiation therapy.  Annual reevaluation.  DIGITAL DIAGNOSTIC BILATERAL MAMMOGRAM WITH CAD  Comparison:  08/25/2011, 08/11/2011, 07/29/2011, 07/14/2010.  Findings:  CC and MLO views of both breasts and a spot tangential view of the right breast at the lumpectomy site were obtained. Heterogeneously dense breast  tissue, unchanged.  Post lumpectomy scarring in the inner right breast with surgical clips.  Benign appearing adjacent punctate calcifications in the upper outer right breast.  No new suspicious mass, nonsurgical architectural distortion, or suspicious calcifications in either breast. Mammographic images were processed with CAD.  IMPRESSION: No specific mammographic evidence of malignancy.  Post lumpectomy scarring in the inner right breast.  RECOMMENDATION: Bilateral diagnostic mammography in 1 year.  The patient was encouraged to perform monthly self breast examination and communicate any changes with her primary physician. I have discussed the findings and recommendations with the patient. Results were also provided in writing at the conclusion of the visit.  BI-RADS CATEGORY 2:  Benign finding(s).   Original Report Authenticated By: Arnell Sieving, M.D.      Assessment:  41 year-old [place], [place] woman, BRCA 1-2 negative, status post right breast biopsy on 08/11/2011 for ductal carcinoma in situ, intermediate  to high grade, strongly estrogen receptor and progesterone positive, with  subsequent reexcision for margin clearance October 2012.; s/p radiation therapy delivered in  [place] completed December 2012, at which time she started tamoxifen.  Plan:  Abbagayle is doing fine. She has dense breasts, which diminishes mammographic sensitivity, and we talked about the possible alternatives or additional tests, which include ultrasound (too focused), breast specific gamma imaging (too much radiation), and breast MRI (too sensitive and too expensive). Accordingly we are continuing with yearly mammography and yearly M.D. breast exam. Of course she is on tamoxifen which cuts in half her risk of developing a new breast cancer or a recurrence of her prior, noninvasive breast cancer  She is tolerating the tamoxifen quite well. I am delighted that she was able to work on her weight so successfully. She will see me  again in one year. She understands we will not be checking labs for her noninvasive breast cancer. She knows to call for any problems that may develop before the next visit. Lowella Dell MD 09/26/2012

## 2012-09-26 NOTE — Telephone Encounter (Signed)
Gave patient appointment for mammogram on 09-27-2013 at 10:30am at the breast center  Gave patient appointment for md on 09-27-2013 at 12:00pm

## 2012-12-01 ENCOUNTER — Encounter (INDEPENDENT_AMBULATORY_CARE_PROVIDER_SITE_OTHER): Payer: Self-pay | Admitting: General Surgery

## 2012-12-01 ENCOUNTER — Ambulatory Visit (INDEPENDENT_AMBULATORY_CARE_PROVIDER_SITE_OTHER): Payer: BC Managed Care – PPO | Admitting: General Surgery

## 2012-12-01 VITALS — BP 118/84 | HR 64 | Temp 97.8°F | Resp 16 | Ht 59.5 in | Wt 131.2 lb

## 2012-12-01 DIAGNOSIS — D0590 Unspecified type of carcinoma in situ of unspecified breast: Secondary | ICD-10-CM

## 2012-12-01 DIAGNOSIS — D0510 Intraductal carcinoma in situ of unspecified breast: Secondary | ICD-10-CM

## 2012-12-01 NOTE — Patient Instructions (Signed)
Follow up in 6 months.    Get mammogram in October.   

## 2012-12-01 NOTE — Progress Notes (Signed)
HISTORY: The patient has been doing well status post right needle localized lumpectomy for DCIS. She is now around 15 months out. She is having some mild hot flashes. She still feels like she has occasionally unilateral pelvic pain every month, but is no longer having periods.  She is continuing to take tamoxifen.   PERTINENT REVIEW OF SYSTEMS: Otherwise negative.     EXAM: Head: Normocephalic and atraumatic.  Eyes:  Conjunctivae are normal. Pupils are equal, round, and reactive to light. No scleral icterus.  Neck:  Normal range of motion. Neck supple. No tracheal deviation present. No thyromegaly present.  Resp: No respiratory distress, normal effort. Breast:  No masses, skin dimpling, or lymphadenopathy.  Scar is well healed.  There is a still slight elevation of the right breast, but this is less pronounced than before.  The breast size is symmetric.  The area of ink has resolved.     Abd:  Abdomen is soft, non distended and non tender. No masses are palpable.  There is no rebound and no guarding.  Neurological: Alert and oriented to person, place, and time. Coordination normal.  Skin: Skin is warm and dry. No rash noted. No diaphoretic. No erythema. No pallor.  Psychiatric: Normal mood and affect. Normal behavior. Judgment and thought content normal.      ASSESSMENT AND PLAN:   DCIS (ductal carcinoma in situ), R breast lower inner quadrant 1 cm, +/+ HR Patient without clinical evidence of disease.   Mammogram in October was clear of concerns.  Followup in October for new mammogram.   Followup with me in 6 months.       Maudry Diego, MD Surgical Oncology, General & Endocrine Surgery [facility]  Jeannett Senior, MD Jeannett Senior, MD

## 2012-12-01 NOTE — Assessment & Plan Note (Signed)
Patient without clinical evidence of disease.   Mammogram in October was clear of concerns.  Followup in October for new mammogram.   Followup with me in 6 months.

## 2013-06-22 ENCOUNTER — Ambulatory Visit (INDEPENDENT_AMBULATORY_CARE_PROVIDER_SITE_OTHER): Payer: BC Managed Care – PPO | Admitting: General Surgery

## 2013-06-22 ENCOUNTER — Encounter (INDEPENDENT_AMBULATORY_CARE_PROVIDER_SITE_OTHER): Payer: Self-pay | Admitting: General Surgery

## 2013-06-22 VITALS — BP 132/80 | HR 76 | Temp 99.7°F | Resp 12 | Ht 60.0 in | Wt 126.8 lb

## 2013-06-22 DIAGNOSIS — D0511 Intraductal carcinoma in situ of right breast: Secondary | ICD-10-CM

## 2013-06-22 DIAGNOSIS — D0590 Unspecified type of carcinoma in situ of unspecified breast: Secondary | ICD-10-CM

## 2013-06-22 NOTE — Assessment & Plan Note (Signed)
Currently no evidence of disease.  Followup mammogram is due in October.  I will see her back in 6 months for clinical exam.  At that point, I can go to yearly exams alternating with Dr. Darnelle Catalan.

## 2013-06-22 NOTE — Patient Instructions (Signed)
WEAR YOUR SUNSCREEN~!!!!!!!  Follow up with me in 6 months.  Mammogram in October.

## 2013-06-22 NOTE — Progress Notes (Signed)
HISTORY: Patient is a 42 year old female who is approximately 18 months status post breast conservation treatment for right-sided DCIS. She is doing well. Her first mammogram last year was clear. She is on her tamoxifen. She has experienced some vaginal dryness and the lightening of her periods but is still having cycles. She is having some hot flashes as well. She denies any new breast masses. She is not having breast pain.   PERTINENT REVIEW OF SYSTEMS: Otherwise negative.  Filed Vitals:   06/22/13 1108  BP: 132/80  Pulse: 76  Temp: 99.7 F (37.6 C)  Resp: 12   Filed Weights   06/22/13 1108  Weight: 126 lb 12.8 oz (57.516 kg)    EXAM: Head: Normocephalic and atraumatic.  Eyes:  Conjunctivae are normal. Pupils are equal, round, and reactive to light. No scleral icterus.  Neck:  Normal range of motion. Neck supple. No tracheal deviation present. No thyromegaly present.  Resp: No respiratory distress, normal effort. Breast:  Breasts are symmetric bilaterally.  Nipple incision well healed.  Minimal firmness at incision.  No lymphadenopathy.  No masses.   Abd:  Abdomen is soft, non distended and non tender. No masses are palpable.  There is no rebound and no guarding.  Neurological: Alert and oriented to person, place, and time. Coordination normal.  Skin: Skin is warm and dry. No rash noted. No diaphoretic. No erythema. No pallor.  Psychiatric: Normal mood and affect. Normal behavior. Judgment and thought content normal.      ASSESSMENT AND PLAN:   DCIS (ductal carcinoma in situ), R breast lower inner quadrant 1 cm, +/+ HR Currently no evidence of disease.  Followup mammogram is due in October.  I will see her back in 6 months for clinical exam.  At that point, I can go to yearly exams alternating with Dr. Darnelle Catalan.      Maudry Diego, MD Surgical Oncology, General & Endocrine Surgery [facility]  Jeannett Senior, MD Jeannett Senior, MD

## 2013-09-26 ENCOUNTER — Ambulatory Visit: Payer: BC Managed Care – PPO | Admitting: Oncology

## 2013-09-27 ENCOUNTER — Other Ambulatory Visit: Payer: Self-pay | Admitting: *Deleted

## 2013-09-27 ENCOUNTER — Ambulatory Visit
Admission: RE | Admit: 2013-09-27 | Discharge: 2013-09-27 | Disposition: A | Discharge status: Home / Self Care | Payer: BC Managed Care – PPO | Source: Ambulatory Visit | Attending: Oncology | Admitting: Oncology

## 2013-09-27 ENCOUNTER — Telehealth: Payer: Self-pay | Admitting: Oncology

## 2013-09-27 ENCOUNTER — Ambulatory Visit (HOSPITAL_BASED_OUTPATIENT_CLINIC_OR_DEPARTMENT_OTHER): Payer: BC Managed Care – PPO | Admitting: Oncology

## 2013-09-27 VITALS — BP 137/90 | HR 78 | Temp 98.4°F | Resp 18 | Ht 60.0 in | Wt 128.9 lb

## 2013-09-27 DIAGNOSIS — D0590 Unspecified type of carcinoma in situ of unspecified breast: Secondary | ICD-10-CM

## 2013-09-27 DIAGNOSIS — D0511 Intraductal carcinoma in situ of right breast: Secondary | ICD-10-CM

## 2013-09-27 DIAGNOSIS — D0510 Intraductal carcinoma in situ of unspecified breast: Secondary | ICD-10-CM

## 2013-09-27 DIAGNOSIS — C50219 Malignant neoplasm of upper-inner quadrant of unspecified female breast: Secondary | ICD-10-CM

## 2013-09-27 DIAGNOSIS — Z170 Estrogen receptor positive status [ER+]: Secondary | ICD-10-CM

## 2013-09-27 MED ORDER — TAMOXIFEN CITRATE 20 MG PO TABS: 20.0000 mg | ORAL_TABLET | Freq: Every day | ORAL | Status: DC

## 2013-09-27 NOTE — Telephone Encounter (Signed)
m °

## 2013-09-27 NOTE — Progress Notes (Signed)
Hematology and Oncology Follow Up Visit  Phyllis Hoffman [id number] 1971-06-29 42 y.o. 09/27/2013   PCP: Jeannett Senior, MD    HPI: Phyllis Hoffman is a [place] woman, referred to [place] in September 2012 at the age of 7 for evaluation of right breast carcinoma.  Bilateral digital mammogram in [place] in August of 2011 showed a focus of microcalcification in the right breast and 6 month followup was recommended. This was performed in February 2012, the calcifications appearing stable as compared to prior study. The patient was recalled for repeat mammography in 6 months to resume her yearly schedule. On 07/29/2011, the calcifications were again noted, some of them appearing more irregular, however, and with previous studies. Left breast was unremarkable.  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 Tumor was ER and PR positive, both at 100%.  Bilateral breast MRIs were obtained 08/18/2011 showing a post biopsy hematoma in the medial right breast. There was a vague patchy no masslike enhancement in the medial right breast near the biopsy cavity, spanning approximately 2.4 cm. No evidence of multifocal or multicentric disease in the right breast. Left breast and lymph nodes appeared benign.  Patient did underwent reexcision of the right medial margin but subsequent clear margins. 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  Patient underwent radiation therapy in [place],completed December 2012.At that time she started tamoxifen. Of note patient is known to be BRCA1 and 2 negative.  Interim History:   Phyllis Hoffman returns with her husband Chip for followup of her breast cancer. The interval history is generally unremarkable. She continues to work at her job, which she enjoys, at which keeps her on her feet "all day". Between the  job ending at 2 PM and her picking up her children at 3 your 3:30 she has an hour which he uses for exercise. She has some hot flashes from the tamoxifen, and sometimes she wakes up at night we disease. She understands we can use gabapentin at bedtime to relieve this symptom, but she prefers to take no other medication.   Review of Systems: Her periods are becoming a bit irregular. She recently was evaluated, she tells me, with an ultrasound which showed a very thick endometrial lining. She had a "scraping procedure", and she bled for about 2 weeks but has not had a period now for 6 weeks. She reminds me that her husband had a vasectomy sometime ago. There is some vaginal dryness and she sometimes uses not estrogen-containing lubricants for this. It is not a major issue for her right now. She had a recent urinary tract infection. Otherwise a detailed review of systems today was noncontributory  Family History: Patient's father is alive at age 48, her mother alive at age 61. Her father had prostate cancer diagnosed proximally 7 years ago, had surgery, and "is fine". The patient has one brother and no sisters. No breast or ovarian cancer in the family to the best of her knowledge.  Gynecologic History: First menstrual period at age 67, still having regular periods. Patient is [place] P2,  first pregnancy to term at age 28. She did use birth control pills for approximately 17 years. Most recent menstrual period earlier this month as noted above.  Social History: Patient works in her Manufacturing engineer. Her husband Leonette Most "[employer], trains showing horses and does quite a bit of traveling. They have 2 children,  1 son, Wilber Oliphant, age 62 and one daughter, Delorise Shiner, age 99. The patient attends the [employer] [facility] [employer] in [place].  Medications: I have reviewed the patient's current medications. Current Outpatient Prescriptions  Medication Sig Dispense Refill  . acidophilus (RISAQUAD) CAPS Take by mouth  daily.        . Ascorbic Acid (VITAMIN C) 1000 MG tablet Take 1,000 mg by mouth 3 (three) times daily.        . fish oil-omega-3 fatty acids 1000 MG capsule Take 1 g by mouth daily.       Marland Kitchen glucosamine-chondroitin 500-400 MG tablet Take 1 tablet by mouth 3 (three) times daily.        . Multiple Vitamin (MULTIVITAMIN) tablet Take 1 tablet by mouth daily.        Marland Kitchen OVER THE COUNTER MEDICATION Take 1 tablet by mouth 3 (three) times daily. Immune system builder       . psyllium (REGULOID) 0.52 G capsule Take 0.52 g by mouth daily.        . tamoxifen (NOLVADEX) 20 MG tablet Take 1 tablet (20 mg total) by mouth daily.  90 tablet  12   No current facility-administered medications for this visit.    Allergies:  Allergies  Allergen Reactions  . Erythromycin Nausea And Vomiting  . Penicillins Other (See Comments)    Happened when she was a child, does not remember reaction.     Filed Vitals:   09/27/13 1149  BP: 137/90  Pulse: 78  Temp: 98.4 F (36.9 C)  Resp: 18   Young-appearing white woman in no acute distress Sclerae unicteric Oropharynx clear No cervical or supraclavicular adenopathy Lungs no rales or rhonchi Heart regular rate and rhythm Abd soft, nontender, positive bowel sounds MSK no focal spinal tenderness, no upper extremity lymphedema Neuro: nonfocal, well oriented, positive affect Breasts: The right breast is status post lumpectomy and radiation. There is no evidence of local recurrence. The right axilla is benign. The left breast is unremarkable    Lab Results: Lab Results  Component Value Date   WBC 4.3 12/29/2011   HGB 14.1 12/29/2011   HCT 41.0 12/29/2011   MCV 92.2 12/29/2011   PLT 191 12/29/2011   NEUTROABS 3.0 12/29/2011     Chemistry      Component Value Date/Time   NA 140 12/29/2011 1002   K 3.9 12/29/2011 1002   CL 104 12/29/2011 1002   CO2 28 12/29/2011 1002   BUN 11 12/29/2011 1002   CREATININE 0.73 12/29/2011 1002      Component Value Date/Time    CALCIUM 9.4 12/29/2011 1002   ALKPHOS 50 12/29/2011 1002   AST 16 12/29/2011 1002   ALT 16 12/29/2011 1002   BILITOT 0.2* 12/29/2011 1002        Radiological Studies: Mm Digital Diagnostic Bilat  09/27/2013   CLINICAL DATA:  History of right lumpectomy for breast cancer 2012  EXAM: DIGITAL DIAGNOSTIC  BILATERAL MAMMOGRAM WITH CAD  DIGITAL BREAST TOMOSYNTHESIS  Digital breast tomosynthesis images are acquired in two projections. These images are reviewed in combination with the digital mammogram, confirming the findings below.  COMPARISON:  Prior exams  ACR Breast Density Category c: The breasts are heterogeneously dense, which may obscure small masses.  FINDINGS: Right lumpectomy changes are reidentified. No new suspicious finding is seen in either breast.  Mammographic images were processed with CAD.  IMPRESSION: No evidence for malignancy. Right lumpectomy changes reidentified.  RECOMMENDATION: Bilateral diagnostic mammography in 1 year  I have discussed the findings and recommendations with the patient. Results were also provided in writing at the conclusion of the visit. If applicable, a reminder letter will be sent to the patient regarding the next appointment.  BI-RADS CATEGORY  2: Benign Finding(s)   Electronically Signed   By: Christiana Pellant M.D.   On: 09/27/2013 11:21     Assessment:  42 y.o. BRCA negative [place], [place] woman,   (1) status post right breast biopsy on 08/11/2011 for ductal carcinoma in situ, intermediate to high grade, strongly estrogen receptor and progesterone positive, with  subsequent reexcision for margin clearance October 2012.;  (2) s/p radiation therapy delivered in  [place] completed December 2012  (3) started tamoxifen December 2012   Plan:  Rand is tolerating the tamoxifen with no unusual side effects. She has some hot flashes, but she would prefer not to take any medication for that. She does have I think the beginning of vaginal atrophy, and was recently  diagnosed with a urinary tract infection which may be related to that. She is using some non-estrogen-containing lubricants, which are helping.  Today we spent a bit of time going over her menopausal issues. She understands the risk of weight gain, mood changes, bone density loss, of course hot flashes, and vaginal dryness. While she is on tamoxifen, I have no problem with her using Vagifem suppositories or Estring if these are helpful to maintain vaginal health. For now she wants to think about this and continued use lubricants as needed.  She is going to return to see [place] again in one year. She knows to call for any problems that may develop before that visit.  Lowella Dell MD 09/27/2013

## 2013-10-01 ENCOUNTER — Other Ambulatory Visit: Payer: Self-pay | Admitting: *Deleted

## 2013-10-01 DIAGNOSIS — C50219 Malignant neoplasm of upper-inner quadrant of unspecified female breast: Secondary | ICD-10-CM

## 2013-10-01 DIAGNOSIS — D0510 Intraductal carcinoma in situ of unspecified breast: Secondary | ICD-10-CM

## 2013-10-01 MED ORDER — TAMOXIFEN CITRATE 20 MG PO TABS: 20.0000 mg | ORAL_TABLET | Freq: Every day | ORAL | Status: DC

## 2013-10-01 NOTE — Telephone Encounter (Signed)
Pt called to state she forgot to obtain a written prescription for refills on tamoxifen at visit last week.  She states recent tamoxifen cost increased and is wondering if she should obtain from a different pharmacy.  This RN informed pt of known cost increase per other patients- prescription will be given to her current pharmacy and if she can locate it cheaper she can call to transfer or obtain a new script if needed.

## 2013-12-18 ENCOUNTER — Encounter (INDEPENDENT_AMBULATORY_CARE_PROVIDER_SITE_OTHER): Payer: Self-pay | Admitting: General Surgery

## 2013-12-18 ENCOUNTER — Ambulatory Visit (INDEPENDENT_AMBULATORY_CARE_PROVIDER_SITE_OTHER): Payer: BC Managed Care – PPO | Admitting: General Surgery

## 2013-12-18 VITALS — BP 124/62 | HR 60 | Temp 98.0°F | Resp 18 | Ht 59.0 in | Wt 129.0 lb

## 2013-12-18 DIAGNOSIS — D0511 Intraductal carcinoma in situ of right breast: Secondary | ICD-10-CM

## 2013-12-18 DIAGNOSIS — D0590 Unspecified type of carcinoma in situ of unspecified breast: Secondary | ICD-10-CM

## 2013-12-18 NOTE — Patient Instructions (Signed)
Follow up with me in April 2016.  Get mammogram in October with Dr. Jana Hakim follow up.  Have a good year!  Good luck with the fam :)

## 2013-12-19 NOTE — Assessment & Plan Note (Signed)
Patient has no evidence of recurrent disease approximately 2-2 and half years status post breast conservation therapy for right-sided DCIS.  I will see her back in approximately a year. She'll followup with medical oncology in between.  Continue tamoxifen.  Patient is going to discuss the possibility of oophorectomy when her gynecologist gets back from medical leave.

## 2013-12-19 NOTE — Progress Notes (Signed)
HISTORY: Patient is a 43 year old female who is approximately 18 months status post breast conservation treatment for right-sided DCIS. She is continuing on tamoxifen. She is not having any hot flashes. She is still continuing to have menstrual cycles. She had one cycle that was extended and had curettage and biopsy of the endometrial scrapings. These were negative. She now has gone back to having regular cycles which only last one to 2 days.  She is having vaginal dryness. She has not had any changes in her breast since I last visit. She denies any new masses or nipple discharge. She has a small contour changed on the radiated side however this is not noticeable in her clothes.   PERTINENT REVIEW OF SYSTEMS: Otherwise negative.  Filed Vitals:   12/18/13 1547  BP: 124/62  Pulse: 60  Temp: 98 F (36.7 C)  Resp: 18   Filed Weights   12/18/13 1547  Weight: 129 lb (58.514 kg)   ROS :  O/w negative x 11.  EXAM: Head: Normocephalic and atraumatic.  Eyes:  Conjunctivae are normal. Pupils are equal, round, and reactive to light. No scleral icterus.  Neck:  Normal range of motion. Neck supple. No tracheal deviation present. No thyromegaly present.  Resp: No respiratory distress, normal effort. Breast:  Breasts are generally symmetric bilaterally.  There is a small contour change at lumpectomy site.  Nipple incision well healed.  Minimal firmness at incision.  No lymphadenopathy.  No masses.   Abd:  Abdomen is soft, non distended and non tender. No masses are palpable.  There is no rebound and no guarding.  Neurological: Alert and oriented to person, place, and time. Coordination normal.  Skin: Skin is warm and dry. No rash noted. No diaphoretic. No erythema. No pallor.  Psychiatric: Normal mood and affect. Normal behavior. Judgment and thought content normal.    Mammogram 08/2013 FINDINGS:  Right lumpectomy changes are reidentified. No new suspicious finding  is seen in either breast.   Mammographic images were processed with CAD  RECOMMENDATION:  Bilateral diagnostic mammography in 1 year   ASSESSMENT AND PLAN:   Neoplasm of right breast, primary tumor staging category Tis: ductal carcinoma in situ (DCIS) Patient has no evidence of recurrent disease approximately 2-2 and half years status post breast conservation therapy for right-sided DCIS.  I will see her back in approximately a year. She'll followup with medical oncology in between.  Continue tamoxifen.  Patient is going to discuss the possibility of oophorectomy when her gynecologist gets back from medical leave.       Milus Height, MD Surgical Oncology, [facility]  Jamey Reas, MD Jamey Reas, MD

## 2014-02-21 ENCOUNTER — Other Ambulatory Visit: Payer: Self-pay | Admitting: Specialist

## 2014-02-21 DIAGNOSIS — Z853 Personal history of malignant neoplasm of breast: Secondary | ICD-10-CM

## 2014-02-21 DIAGNOSIS — N6310 Unspecified lump in the right breast, unspecified quadrant: Secondary | ICD-10-CM

## 2014-02-28 ENCOUNTER — Ambulatory Visit
Admission: RE | Admit: 2014-02-28 | Discharge: 2014-02-28 | Disposition: A | Discharge status: Home / Self Care | Payer: BC Managed Care – PPO | Source: Ambulatory Visit | Attending: Specialist | Admitting: Specialist

## 2014-02-28 DIAGNOSIS — N6310 Unspecified lump in the right breast, unspecified quadrant: Secondary | ICD-10-CM

## 2014-02-28 DIAGNOSIS — Z853 Personal history of malignant neoplasm of breast: Secondary | ICD-10-CM

## 2014-05-19 ENCOUNTER — Telehealth: Payer: Self-pay | Admitting: Oncology

## 2014-05-19 NOTE — Telephone Encounter (Signed)
per GM to r/s-prev appt sch GM on PAL-will call to adv pt of new time & date

## 2014-05-20 ENCOUNTER — Telehealth: Payer: Self-pay | Admitting: Oncology

## 2014-05-20 NOTE — Telephone Encounter (Signed)
cld & spoke w/pt in re to appt change time & date-pt understood-

## 2014-05-21 ENCOUNTER — Telehealth: Payer: Self-pay | Admitting: Oncology

## 2014-05-21 NOTE — Telephone Encounter (Signed)
pt cld to state could not keep appt time-was getting mamm @ beginning of Nov-r/s for 11/19-gave pt time-pt understood

## 2014-05-22 ENCOUNTER — Other Ambulatory Visit: Payer: Self-pay | Admitting: Oncology

## 2014-05-22 DIAGNOSIS — Z853 Personal history of malignant neoplasm of breast: Secondary | ICD-10-CM

## 2014-05-22 DIAGNOSIS — N63 Unspecified lump in unspecified breast: Secondary | ICD-10-CM

## 2014-09-26 ENCOUNTER — Ambulatory Visit: Payer: BC Managed Care – PPO | Admitting: Oncology

## 2014-09-30 ENCOUNTER — Ambulatory Visit: Payer: BC Managed Care – PPO | Admitting: Oncology

## 2014-10-17 ENCOUNTER — Ambulatory Visit
Admission: RE | Admit: 2014-10-17 | Discharge: 2014-10-17 | Disposition: A | Discharge status: Home / Self Care | Payer: BC Managed Care – PPO | Source: Ambulatory Visit | Attending: Oncology | Admitting: Oncology

## 2014-10-17 ENCOUNTER — Ambulatory Visit (HOSPITAL_BASED_OUTPATIENT_CLINIC_OR_DEPARTMENT_OTHER): Payer: BC Managed Care – PPO | Admitting: Oncology

## 2014-10-17 ENCOUNTER — Other Ambulatory Visit: Payer: Self-pay | Admitting: Oncology

## 2014-10-17 ENCOUNTER — Telehealth: Payer: Self-pay | Admitting: Oncology

## 2014-10-17 VITALS — BP 126/85 | HR 71 | Temp 98.4°F | Resp 18 | Ht 59.0 in | Wt 134.5 lb

## 2014-10-17 DIAGNOSIS — Z853 Personal history of malignant neoplasm of breast: Secondary | ICD-10-CM

## 2014-10-17 DIAGNOSIS — Z170 Estrogen receptor positive status [ER+]: Secondary | ICD-10-CM

## 2014-10-17 DIAGNOSIS — D0511 Intraductal carcinoma in situ of right breast: Secondary | ICD-10-CM

## 2014-10-17 NOTE — Telephone Encounter (Signed)
m °

## 2014-10-17 NOTE — Progress Notes (Signed)
Hematology and Oncology Follow Up Visit  Phyllis Hoffman [id number] May 02, 1971 43 y.o. [birth year]   PCP: Jamey Reas, MD GYN: SU:  OTHER MD:  HISTORY OF BREAST CANCER: From the original intake note:  Phyllis Hoffman is a [place] woman, referred to [place] in September 2012 at the age of 4 for evaluation of right breast carcinoma.  Bilateral digital mammogram in [place] in August of 2011 showed a focus of microcalcification in the right breast and 6 month followup was recommended. This was performed in February 2012, the calcifications appearing stable as compared to prior study. The patient was recalled for repeat mammography in 6 months to resume her yearly schedule. On 07/29/2011, the calcifications were again noted, some of them appearing more irregular, however, and with previous studies. Left breast was unremarkable.  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 Tumor was ER and PR positive, both at 100%.  Bilateral breast MRIs were obtained 08/18/2011 showing a post biopsy hematoma in the medial right breast. There was a vague patchy no masslike enhancement in the medial right breast near the biopsy cavity, spanning approximately 2.4 cm. No evidence of multifocal or multicentric disease in the right breast. Left breast and lymph nodes appeared benign.  Patient did underwent reexcision of the right medial margin but subsequent clear margins. 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  Patient underwent radiation therapy in [place],completed December 2012.At that time she started tamoxifen. Of note patient is known to be BRCA1 and 2 negative.  Her subsequent history is as detailed below  Interim History:   Phyllis Hoffman returns today for follow-up of her noninvasive breast cancer, accompanied by her husband Chip.  The interval history is generally unremarkable. She continues on tamoxifen, with good tolerance. Hot flashes and vaginal dryness are not issues, and she continues to have normal regular periods.  Review of Systems: A detailed review of systems today was entirely benign  Family History: Patient's father is alive at age 73, her mother alive at age 46. Her father had prostate cancer diagnosed proximally 7 years ago, had surgery, and "is fine". The patient has one brother and no sisters. No breast or ovarian cancer in the family to the best of her knowledge.  Gynecologic History: First menstrual period at age 64, still having regular periods. Patient is [place] P2,  first pregnancy to term at age 57. She did use birth control pills for approximately 17 years. She continues to menstruate regularly.  Social History: Patient works in her Chartered loss adjuster. Her husband Phyllis Hoffman "[name], trains show horses and does quite a bit of traveling. They have 2 children, 1 son, Phyllis Hoffman, age 10 and one daughter, Phyllis Hoffman, age 23. The patient attends the [employer] in [place].  Medications: I have reviewed the patient's current medications. Current Outpatient Prescriptions  Medication Sig Dispense Refill  . acidophilus (RISAQUAD) CAPS Take by mouth daily.      . Ascorbic Acid (VITAMIN C) 1000 MG tablet Take 1,000 mg by mouth 3 (three) times daily.      . fish oil-omega-3 fatty acids 1000 MG capsule Take 1 g by mouth daily.     Marland Kitchen glucosamine-chondroitin 500-400 MG tablet Take 1 tablet by mouth 3 (three) times daily.      . Multiple Vitamin (MULTIVITAMIN) tablet Take 1 tablet by mouth daily.      Marland Kitchen OVER THE  COUNTER MEDICATION Take 1 tablet by mouth 3 (three) times daily. Immune system builder     . psyllium (REGULOID) 0.52 G capsule Take 0.52 g by mouth daily.      . tamoxifen (NOLVADEX) 20 MG tablet Take 1 tablet (20 mg total) by mouth daily. 90 tablet 4   No current facility-administered  medications for this visit.    Allergies:  Allergies  Allergen Reactions  . Erythromycin Nausea And Vomiting  . Penicillins Other (See Comments)    Happened when she was a child, does not remember reaction.      Filed Vitals:   10/17/14 1220  BP: 126/85  Pulse: 71  Temp: 98.4 F (36.9 C)  Resp: 39   Young White woman who appears well Sclerae unicteric, pupils round and equal Oropharynx clear, teeth in good repair No cervical or supraclavicular adenopathy Lungs no rales or rhonchi Heart regular rate and rhythm Abd soft, nontender, positive bowel sounds MSK no focal spinal tenderness, no upper extremity lymphedema Neuro: nonfocal, well oriented, positive affect Breasts: The right breast is status post lumpectomy and radiation. There is no evidence of local recurrence. The right axilla is benign. The left breast is unremarkable    Lab Results: Lab Results  Component Value Date   WBC 4.3 12/29/2011   HGB 14.1 12/29/2011   HCT 41.0 12/29/2011   MCV 92.2 12/29/2011   PLT 191 12/29/2011   NEUTROABS 3.0 12/29/2011     Chemistry      Component Value Date/Time   NA 140 12/29/2011 1002   K 3.9 12/29/2011 1002   CL 104 12/29/2011 1002   CO2 28 12/29/2011 1002   BUN 11 12/29/2011 1002   CREATININE 0.73 12/29/2011 1002      Component Value Date/Time   CALCIUM 9.4 12/29/2011 1002   ALKPHOS 50 12/29/2011 1002   AST 16 12/29/2011 1002   ALT 16 12/29/2011 1002   BILITOT 0.2* 12/29/2011 1002        Radiological Studies: Mm Diag Breast Tomo Bilateral  10/17/2014   CLINICAL DATA:  Patient underwent lumpectomy on the right for breast carcinoma in 2012. Yearly diagnostic follow-up. No current complaints.  EXAM: DIGITAL DIAGNOSTIC BILATERAL MAMMOGRAM WITH 3D TOMOSYNTHESIS AND CAD  COMPARISON:  None.  ACR Breast Density Category c: The breast tissue is heterogeneously dense, which may obscure small masses.  FINDINGS: On the initial images, increased density with suggested in  the retroareolar region on the left. However, all on followup tomosynthesis imaging, there is no underlying mass or distortion.  Post biopsy changes on the right are stable. There are no discrete masses. No other areas of distortion. There is no evidence of recurrent malignancy or new breast carcinoma.  Mammographic images were processed with CAD.  IMPRESSION: No evidence of recurrent or new breast malignancy. Benign postsurgical changes on the right.  RECOMMENDATION: Diagnostic mammography in 1 year per standard post lumpectomy protocol.  I have discussed the findings and recommendations with the patient. Results were also provided in writing at the conclusion of the visit. If applicable, a reminder letter will be sent to the patient regarding the next appointment.  BI-RADS CATEGORY  2: Benign.   Electronically Signed   By: Lajean Manes M.D.   On: 10/17/2014 11:31     Assessment:  43 y.o. BRCA negative [place], [place] woman,   (1) status post right breast biopsy on 08/11/2011 for ductal carcinoma in situ, intermediate to high grade, strongly estrogen receptor and progesterone positive, with  subsequent reexcision for margin clearance October 2012.;  (2) s/p radiation therapy delivered in  [place] completed December 2012  (3) started tamoxifen December 2012   (4) breast density category C  Plan:  Janaysia is doing fine from a breast cancer point of view and we are going to continue tamoxifen until she completes 5 years.   She is interested in taking some supplements and she brought me the list of ingredients today. I reviewed those in detail. While the ingredients appear benign enough, I encouraged her to question the company as to where they get their supplies. If any of the material comes from [place] I would suggest that she avoid this supplements.  Because as a young woman she has dense breasts have encouraged her to always obtain tomosynthesis with her mammography.  Otherwise she will return to see  me in one year. She knows to call for any problems that may develop before the next visit here.     Chauncey Cruel, MD  10/17/2014

## 2014-10-21 NOTE — Addendum Note (Signed)
Addended by: Laureen Abrahams on: 10/21/2014 06:54 PM   Modules accepted: Medications

## 2014-12-27 ENCOUNTER — Other Ambulatory Visit: Payer: Self-pay | Admitting: Oncology

## 2015-03-28 ENCOUNTER — Other Ambulatory Visit: Payer: Self-pay | Admitting: Oncology

## 2015-08-05 ENCOUNTER — Other Ambulatory Visit: Payer: Self-pay | Admitting: Oncology

## 2015-08-05 DIAGNOSIS — Z853 Personal history of malignant neoplasm of breast: Secondary | ICD-10-CM

## 2015-09-29 ENCOUNTER — Other Ambulatory Visit: Payer: Self-pay | Admitting: Oncology

## 2015-10-16 ENCOUNTER — Other Ambulatory Visit: Payer: Self-pay

## 2015-10-16 DIAGNOSIS — D0511 Intraductal carcinoma in situ of right breast: Secondary | ICD-10-CM

## 2015-10-20 ENCOUNTER — Ambulatory Visit (HOSPITAL_BASED_OUTPATIENT_CLINIC_OR_DEPARTMENT_OTHER): Payer: BLUE CROSS/BLUE SHIELD | Admitting: Oncology

## 2015-10-20 ENCOUNTER — Telehealth: Payer: Self-pay | Admitting: Oncology

## 2015-10-20 ENCOUNTER — Other Ambulatory Visit (HOSPITAL_BASED_OUTPATIENT_CLINIC_OR_DEPARTMENT_OTHER): Payer: BLUE CROSS/BLUE SHIELD

## 2015-10-20 ENCOUNTER — Ambulatory Visit
Admission: RE | Admit: 2015-10-20 | Discharge: 2015-10-20 | Disposition: A | Discharge status: Home / Self Care | Payer: BLUE CROSS/BLUE SHIELD | Source: Ambulatory Visit | Attending: Oncology | Admitting: Oncology

## 2015-10-20 VITALS — BP 147/91 | HR 71 | Temp 98.0°F | Resp 18 | Ht 59.0 in | Wt 135.0 lb

## 2015-10-20 DIAGNOSIS — D0511 Intraductal carcinoma in situ of right breast: Secondary | ICD-10-CM

## 2015-10-20 DIAGNOSIS — Z853 Personal history of malignant neoplasm of breast: Secondary | ICD-10-CM

## 2015-10-20 LAB — CBC WITH DIFFERENTIAL/PLATELET
BASO%: 0.4 % (ref 0.0–2.0)
BASOS ABS: 0 10*3/uL (ref 0.0–0.1)
EOS ABS: 0 10*3/uL (ref 0.0–0.5)
EOS%: 0.5 % (ref 0.0–7.0)
HCT: 43.7 % (ref 34.8–46.6)
HEMOGLOBIN: 14.3 g/dL (ref 11.6–15.9)
LYMPH%: 31.5 % (ref 14.0–49.7)
MCH: 30.4 pg (ref 25.1–34.0)
MCHC: 32.7 g/dL (ref 31.5–36.0)
MCV: 92.9 fL (ref 79.5–101.0)
MONO#: 0.3 10*3/uL (ref 0.1–0.9)
MONO%: 4.6 % (ref 0.0–14.0)
NEUT#: 3.5 10*3/uL (ref 1.5–6.5)
NEUT%: 63 % (ref 38.4–76.8)
PLATELETS: 200 10*3/uL (ref 145–400)
RBC: 4.7 10*6/uL (ref 3.70–5.45)
RDW: 12.5 % (ref 11.2–14.5)
WBC: 5.5 10*3/uL (ref 3.9–10.3)
lymph#: 1.7 10*3/uL (ref 0.9–3.3)

## 2015-10-20 LAB — COMPREHENSIVE METABOLIC PANEL (CC13)
ALBUMIN: 3.9 g/dL (ref 3.5–5.0)
ALK PHOS: 54 U/L (ref 40–150)
ALT: 13 U/L (ref 0–55)
ANION GAP: 9 meq/L (ref 3–11)
AST: 16 U/L (ref 5–34)
BUN: 9.7 mg/dL (ref 7.0–26.0)
CALCIUM: 9.1 mg/dL (ref 8.4–10.4)
CO2: 26 mEq/L (ref 22–29)
Chloride: 106 mEq/L (ref 98–109)
Creatinine: 0.8 mg/dL (ref 0.6–1.1)
EGFR: 83 mL/min/{1.73_m2} — AB (ref >90)
Glucose: 96 mg/dl (ref 70–140)
POTASSIUM: 4 meq/L (ref 3.5–5.1)
SODIUM: 141 meq/L (ref 136–145)
TOTAL PROTEIN: 6.7 g/dL (ref 6.4–8.3)

## 2015-10-20 MED ORDER — TAMOXIFEN CITRATE 20 MG PO TABS: 20.0000 mg | ORAL_TABLET | Freq: Every day | ORAL | Status: DC

## 2015-10-20 NOTE — Progress Notes (Signed)
Hematology and Oncology Follow Up Visit  Phyllis Hoffman [id number] 06/01/1971 44 y.o. 95/62/1308   PCP: Jamey Reas, MD GYN: SU:  OTHER MD:  HISTORY OF BREAST CANCER: From the original intake note:  Phyllis Hoffman is a [place] woman, referred to [place] in September 2012 at the age of 39 for evaluation of right breast carcinoma.  Bilateral digital mammogram in [place] in August of 2011 showed a focus of microcalcification in the right breast and 6 month followup was recommended. This was performed in February 2012, the calcifications appearing stable as compared to prior study. The patient was recalled for repeat mammography in 6 months to resume her yearly schedule. On 07/29/2011, the calcifications were again noted, some of them appearing more irregular, however, and with previous studies. Left breast was unremarkable.  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 Tumor was ER and PR positive, both at 100%.  Bilateral breast MRIs were obtained 08/18/2011 showing a post biopsy hematoma in the medial right breast. There was a vague patchy no masslike enhancement in the medial right breast near the biopsy cavity, spanning approximately 2.4 cm. No evidence of multifocal or multicentric disease in the right breast. Left breast and lymph nodes appeared benign.  Patient did underwent reexcision of the right medial margin but subsequent clear margins. 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  Patient underwent radiation therapy in [place],completed December 2012.At that time she started tamoxifen. Of note patient is known to be BRCA1 and 2 negative.  Her subsequent history is as detailed below  Interim History:   Phyllis Hoffman returns today for follow-up of her ductal carcinoma in situ, accompanied by her husband Chip. The  interval history is generally unremarkable. She goes to the gym 4 days a week, one hour each day. She is doing some fairly stiff weights. She is tolerating tamoxifen with some hot flashes particularly at night but nothing she would want to get treatment 4. She obtains a drug at  Review of Systems: A detailed review of systems today was benign  Family History: Patient's father is alive at age 18, her mother alive at age 32. Her father had prostate cancer diagnosed proximally 7 years ago, had surgery, and "is fine". The patient has one brother and no sisters. No breast or ovarian cancer in the family to the best of her knowledge.  Gynecologic History: First menstrual period at age 83, still having regular periods. Patient is [place] P2,  first pregnancy to term at age 44. She did use birth control pills for approximately 17 years. She continues to menstruate regularly.  Social History: Patient works in a [employer] and development [employer]. Her husband Phyllis Hoffman, trains show horses and does quite a bit of traveling. They have 2 children, 1 son, [name], age 31 and one daughter, [name], age 54. The patient attends the [employer] in [place].  Medications: I have reviewed the patient's current medications. Current Outpatient Prescriptions  Medication Sig Dispense Refill  . acidophilus (RISAQUAD) CAPS Take by mouth daily.      . Ascorbic Acid (VITAMIN C) 1000 MG tablet Take 1,000 mg by mouth 3 (three) times daily.      . fish oil-omega-3 fatty acids 1000 MG capsule Take 1 g by mouth daily.     Marland Kitchen glucosamine-chondroitin 500-400 MG tablet Take 1 tablet by mouth 3 (three) times daily.      Marland Kitchen  Multiple Vitamin (MULTIVITAMIN) tablet Take 1 tablet by mouth daily.      Marland Kitchen OVER THE COUNTER MEDICATION Take 1 tablet by mouth 3 (three) times daily. Immune system builder     . tamoxifen (NOLVADEX) 20 MG tablet Take 1 tablet (20 mg total) by mouth daily. 90 tablet 1   No current facility-administered  medications for this visit.    Allergies:  Allergies  Allergen Reactions  . Erythromycin Nausea And Vomiting  . Penicillins Other (See Comments)    Happened when she was a child, does not remember reaction.      Filed Vitals:   10/20/15 1217  BP: 147/91  Pulse: 71  Temp: 98 F (36.7 C)  Resp: 18   Sclerae unicteric, pupils round and equal Oropharynx clear and moist-- no thrush or other lesions No cervical or supraclavicular adenopathy Lungs no rales or rhonchi Heart regular rate and rhythm Abd soft, nontender, positive bowel sounds MSK no focal spinal tenderness, no upper extremity lymphedema Neuro: nonfocal, well oriented, appropriate affect Breasts: The right breast is status post lumpectomy followed by radiation. There is no evidence of local recurrence. The cosmetic result is excellent. The right axilla is benign. The left breast is unremarkable.  Lab Results: Lab Results  Component Value Date   WBC 5.5 10/20/2015   HGB 14.3 10/20/2015   HCT 43.7 10/20/2015   MCV 92.9 10/20/2015   PLT 200 10/20/2015   NEUTROABS 3.5 10/20/2015     Chemistry      Component Value Date/Time   NA 140 12/29/2011 1002   K 3.9 12/29/2011 1002   CL 104 12/29/2011 1002   CO2 28 12/29/2011 1002   BUN 11 12/29/2011 1002   CREATININE 0.73 12/29/2011 1002      Component Value Date/Time   CALCIUM 9.4 12/29/2011 1002   ALKPHOS 50 12/29/2011 1002   AST 16 12/29/2011 1002   ALT 16 12/29/2011 1002   BILITOT 0.2* 12/29/2011 1002        Radiological Studies: Mm Diag Breast Tomo Bilateral  10/20/2015  CLINICAL DATA:  History of treated right breast cancer, status post lumpectomy and radiation therapy in 2012. EXAM: DIGITAL DIAGNOSTIC BILATERAL MAMMOGRAM WITH 3D TOMOSYNTHESIS AND CAD COMPARISON:  Previous exam(s). ACR Breast Density Category c: The breast tissue is heterogeneously dense, which may obscure small masses. FINDINGS: There are no suspicious masses, areas of nonsurgical  architectural distortion or microcalcifications in each breast. Post lumpectomy and posttreatment changes in the right breast are stable. Mammographic images were processed with CAD. IMPRESSION: No mammographic evidence of malignancy in the right breast. RECOMMENDATION: Diagnostic mammogram is suggested in 1 year. (Code:DM-B-01Y) I have discussed the findings and recommendations with the patient. Results were also provided in writing at the conclusion of the visit. If applicable, a reminder letter will be sent to the patient regarding the next appointment. BI-RADS CATEGORY  2: Benign. Electronically Signed   By: Fidela Salisbury M.D.   On: 10/20/2015 11:30      Assessment:  44 y.o. BRCA negative [place], [place] woman,   (1) status post right breast biopsy on 08/11/2011 for ductal carcinoma in situ, intermediate to high grade, strongly estrogen receptor and progesterone positive, with  subsequent reexcision for margin clearance October 2012.;  (2) s/p radiation therapy delivered in  [place] completed December 2012  (3) started tamoxifen December 2012   (4) breast density category C  Plan:  Jolina appears very healthy and she is tolerating the tamoxifen well. She has one more  year to go after which she will "graduate".  We did discuss the survivorship program. She really is not interested. Coming here is difficult for her as the patient because she sees some any other patients who are so much worse off than she is.  She may however be interested in volunteering. We will discuss that at the next visit here, which likely will be her final 1.    Chauncey Cruel, MD  10/20/2015

## 2015-10-20 NOTE — Telephone Encounter (Signed)
Appointments made and avs printed for patient °

## 2015-12-25 ENCOUNTER — Encounter: Payer: Self-pay | Admitting: Oncology

## 2015-12-25 ENCOUNTER — Other Ambulatory Visit: Payer: Self-pay | Admitting: Oncology

## 2015-12-25 ENCOUNTER — Telehealth: Payer: Self-pay | Admitting: *Deleted

## 2015-12-25 NOTE — Telephone Encounter (Signed)
Pt left VM stating need for letter from MD stating medical necessity for  3-D mammogram.  Per message pt states [employer] denying payment as " un necessary ".  Order per MD is for 3-D mammo obtained 10/20/2015.  Pt had 3-D mammo prior year as well.  Above will be given to MD for review.  Return call number given as [phone].

## 2016-03-25 ENCOUNTER — Other Ambulatory Visit: Payer: Self-pay | Admitting: *Deleted

## 2016-03-25 MED ORDER — TAMOXIFEN CITRATE 20 MG PO TABS: 20.0000 mg | ORAL_TABLET | Freq: Every day | ORAL | Status: DC

## 2016-06-07 ENCOUNTER — Other Ambulatory Visit: Payer: BLUE CROSS/BLUE SHIELD

## 2016-06-07 ENCOUNTER — Other Ambulatory Visit: Payer: Self-pay | Admitting: General Surgery

## 2016-06-07 ENCOUNTER — Other Ambulatory Visit: Payer: Self-pay | Admitting: *Deleted

## 2016-06-07 DIAGNOSIS — Z8051 Family history of malignant neoplasm of kidney: Secondary | ICD-10-CM

## 2016-06-11 ENCOUNTER — Other Ambulatory Visit: Payer: Self-pay | Admitting: Oncology

## 2016-06-11 DIAGNOSIS — Z853 Personal history of malignant neoplasm of breast: Secondary | ICD-10-CM

## 2016-06-14 ENCOUNTER — Ambulatory Visit
Admission: RE | Admit: 2016-06-14 | Discharge: 2016-06-14 | Disposition: A | Discharge status: Home / Self Care | Payer: BLUE CROSS/BLUE SHIELD | Source: Ambulatory Visit | Attending: General Surgery | Admitting: General Surgery

## 2016-06-14 DIAGNOSIS — Z8051 Family history of malignant neoplasm of kidney: Secondary | ICD-10-CM

## 2016-06-15 NOTE — Progress Notes (Signed)
Quick Note:  Please let patient know renal ultrasound is normal. ______

## 2016-06-29 ENCOUNTER — Encounter: Payer: Self-pay | Admitting: Genetic Counselor

## 2016-07-07 ENCOUNTER — Ambulatory Visit: Payer: BLUE CROSS/BLUE SHIELD

## 2016-07-07 ENCOUNTER — Encounter: Payer: Self-pay | Admitting: Genetic Counselor

## 2016-07-07 ENCOUNTER — Ambulatory Visit: Payer: BLUE CROSS/BLUE SHIELD | Admitting: Genetic Counselor

## 2016-07-07 DIAGNOSIS — Z8042 Family history of malignant neoplasm of prostate: Secondary | ICD-10-CM

## 2016-07-07 DIAGNOSIS — Z800 Family history of malignant neoplasm of digestive organs: Secondary | ICD-10-CM | POA: Insufficient documentation

## 2016-07-07 DIAGNOSIS — D0511 Intraductal carcinoma in situ of right breast: Secondary | ICD-10-CM

## 2016-07-07 DIAGNOSIS — Z8051 Family history of malignant neoplasm of kidney: Secondary | ICD-10-CM | POA: Insufficient documentation

## 2016-07-07 NOTE — Progress Notes (Signed)
REFERRING PROVIDER: Jamey Reas, MD [address] [place] [address]   Phyllis Del, MD  Phyllis Klein, MD  PRIMARY PROVIDER:  Jamey Reas, MD  PRIMARY REASON FOR VISIT:  1. Ductal carcinoma in situ (DCIS) of right breast   2. Family history of kidney cancer   3. Family history of prostate cancer   4. Family history of pancreatic cancer   5. Family history of colon cancer      HISTORY OF PRESENT ILLNESS:   Phyllis Hoffman, a 45 y.o. female, was seen for a Phyllis Hoffman cancer genetics consultation at the request of Dr. Alferd Apa due to a personal and family history of cancer.  Phyllis Hoffman presents to clinic today to discuss the possibility of a hereditary predisposition to cancer, genetic testing, and to further clarify her future cancer risks, as well as potential cancer risks for family members.   In 2012, at the age of 44, Phyllis Hoffman was diagnosed with DCIS of the right breast. This was treated with lumpectomy.  At the time she had genetic testing through [facility] and was negative.    CANCER HISTORY:   No history exists.     HORMONAL RISK FACTORS:  Menarche was at age 52.  First live birth at age 65.  OCP use for approximately 17 years.  Ovaries intact: yes.  Hysterectomy: no.    Past Medical History:  Diagnosis Date  . Cancer [facility]) 2012   right DCIS  . Family history of colon cancer   . Family history of kidney cancer   . Family history of pancreatic cancer   . Family history of prostate cancer   . GERD (gastroesophageal reflux disease)   . Hyperlipidemia   . Hypertension     Past Surgical History:  Procedure Laterality Date  . BREAST SURGERY  08/25/11, 09/13/11   lumpectomy - right  . CESAREAN SECTION      Social History   Social History  . Marital status: Married    Spouse name: Chip  . Number of children: 2  . Years of education: N/A   Occupational History  . works in school cafeteria [employer]   Social History  Main Topics  . Smoking status: Never Smoker  . Smokeless tobacco: Never Used  . Alcohol use No  . Drug use: No  . Sexual activity: Not on file   Other Topics Concern  . Not on file   Social History Narrative   Phyllis Hoffman- spouse   Phyllis Hoffman- 54 son   Phyllis Hoffman 61 - daughter     FAMILY HISTORY:  We obtained a detailed, 4-generation family history.  Significant diagnoses are listed below: Family History  Problem Relation Age of Onset  . Cancer Father     prostate  . Kidney cancer Brother 59    mets to lung, brain, pancreas, other kidney  . Melanoma Paternal Aunt   . Heart failure Maternal Grandmother   . Multiple myeloma Maternal Grandfather   . Dementia Paternal Grandmother   . Pulmonary fibrosis Paternal Grandfather   . Colon cancer Other     MGF's mother (great grandmother)  . Pancreatic cancer Other     MGF's sister (great aunt)    The patient has two children who are cancer free.  Her brother recently passed at 75 from metastatic kidney cancer. He did not know that he had cancer.  Her parents are alive. Her mother is 46 and had one brother who died of hepatitis.  Her mother died  of heart failure and her father died of multiple myeloma.  Her grandfather had a brother and sister who died of lung cancer, a sister who had pancreatic cancer and his mother died of colon cancer.  The patient's father was diagnosed with prostate cancer at 13.  He had two sisters, one who had melanoma at 44.  His parents are deceased.  His father died of pulmonary fibrosis and his mother died with dementia.  Patient's maternal ancestors are of Caucasian descent, and paternal ancestors are of Caucasian descent. There is no reported Ashkenazi Jewish ancestry. There is no known consanguinity.  GENETIC COUNSELING ASSESSMENT: Phyllis Hoffman is a 45 y.o. female with a personal and family history of cancer which is somewhat suggestive of a hereditary cancer syndrome and predisposition to cancer. We, therefore, discussed  and recommended the following at today's visit.   DISCUSSION: We discussed that about 5-10% of breast cancer is hereditary with most cases due to BRCA mutations.  She had tested negative in the past, however since that time we are testing more genes, and also repeating BRCA testing for variants that were not able to be identified through the technology at the time.  We discussed that about 3-5% of kidney cancer is hereditary.  We are just now emerging with renal cancer genetics, and while we can test for some cases of renal cancer, there are likely to be genes identified in the future that we are not testing now.  We reviewed the characteristics, features and inheritance patterns of hereditary cancer syndromes. We also discussed genetic testing, including the appropriate family members to test, the process of testing, insurance coverage and turn-around-time for results. We discussed the implications of a negative, positive and/or variant of uncertain significant result. We recommended Phyllis Hoffman pursue genetic testing for the breast/ovarian cancer, renal cancer and colon cancer genes.   Based on Phyllis Hoffman's personal and family history of cancer, she meets medical criteria for genetic testing. Despite that she meets criteria, she may still have an out of pocket cost. We discussed that if her out of pocket cost for testing is over $100, the laboratory will call and confirm whether she wants to proceed with testing.  If the out of pocket cost of testing is less than $100 she will be billed by the genetic testing laboratory.   PLAN: After considering the risks, benefits, and limitations, Phyllis Hoffman  provided informed consent to pursue genetic testing and the blood sample was sent to [facility] for analysis of the renal, breast/ovarian and colon cancer genes. Results should be available within approximately 2-3 weeks' time, at which point they will be disclosed by telephone to Phyllis Hoffman, as will  any additional recommendations warranted by these results. Phyllis Hoffman will receive a summary of her genetic counseling visit and a copy of her results once available. This information will also be available in Epic. We encouraged Ms. Peyton to remain in contact with cancer genetics annually so that we can continuously update the family history and inform her of any changes in cancer genetics and testing that may be of benefit for her family. Ms. Stratton questions were answered to her satisfaction today. Our contact information was provided should additional questions or concerns arise.  Lastly, we encouraged Ms. Albin to remain in contact with cancer genetics annually so that we can continuously update the family history and inform her of any changes in cancer genetics and testing that may be of benefit for this family.  Ms.  Holsworth questions were answered to her satisfaction today. Our contact information was provided should additional questions or concerns arise. Thank you for the referral and allowing [place] to share in the care of your patient.   Karen P. Florene Glen, [place], [facility] Certified Genetic Counselor [email or web address] phone: [phone]  The patient was seen for a total of 45 minutes in face-to-face genetic counseling.  This patient was discussed with Drs. Magrinat, Lindi Adie and/or Burr Medico who agrees with the above.    _______________________________________________________________________ For Office Staff:  Number of people involved in session: 2 Was an Intern/ student involved with case: no

## 2016-07-27 ENCOUNTER — Telehealth: Payer: Self-pay | Admitting: Genetic Counselor

## 2016-07-27 ENCOUNTER — Ambulatory Visit: Payer: Self-pay | Admitting: Genetic Counselor

## 2016-07-27 DIAGNOSIS — D0511 Intraductal carcinoma in situ of right breast: Secondary | ICD-10-CM

## 2016-07-27 DIAGNOSIS — Z1379 Encounter for other screening for genetic and chromosomal anomalies: Secondary | ICD-10-CM | POA: Insufficient documentation

## 2016-07-27 DIAGNOSIS — Z800 Family history of malignant neoplasm of digestive organs: Secondary | ICD-10-CM

## 2016-07-27 DIAGNOSIS — Z8051 Family history of malignant neoplasm of kidney: Secondary | ICD-10-CM

## 2016-07-27 DIAGNOSIS — Z8042 Family history of malignant neoplasm of prostate: Secondary | ICD-10-CM

## 2016-07-27 NOTE — Progress Notes (Signed)
HPI: Ms. Saephanh was previously seen in the [facility] clinic due to a personal and family history of cancer and concerns regarding a hereditary predisposition to cancer. Please refer to our prior cancer genetics clinic note for more information regarding Ms. Gilham's medical, social and family histories, and our assessment and recommendations, at the time. Ms. Sappington recent genetic test results were disclosed to her, as were recommendations warranted by these results. These results and recommendations are discussed in more detail below.  FAMILY HISTORY:  We obtained a detailed, 4-generation family history.  Significant diagnoses are listed below: Family History  Problem Relation Age of Onset  . Cancer Father     prostate  . Kidney cancer Brother 23    mets to lung, brain, pancreas, other kidney  . Melanoma Paternal Aunt   . Heart failure Maternal Grandmother   . Multiple myeloma Maternal Grandfather   . Dementia Paternal Grandmother   . Pulmonary fibrosis Paternal Grandfather   . Colon cancer Other     MGF's mother (great grandmother)  . Pancreatic cancer Other     MGF's sister (great aunt)    The patient has two children who are cancer free.  Her brother recently passed at 54 from metastatic kidney cancer. He did not know that he had cancer.  Her parents are alive. Her mother is 21 and had one brother who died of hepatitis.  Her mother died of heart failure and her father died of multiple myeloma.  Her grandfather had a brother and sister who died of lung cancer, a sister who had pancreatic cancer and his mother died of colon cancer.  The patient's father was diagnosed with prostate cancer at 50.  He had two sisters, one who had melanoma at 19.  His parents are deceased.  His father died of pulmonary fibrosis and his mother died with dementia.  Patient's maternal ancestors are of Caucasian descent, and paternal ancestors are of Caucasian descent. There is no reported  Ashkenazi Jewish ancestry. There is no known consanguinity.  GENETIC TEST RESULTS: At the time of Ms. Cleckley's visit, we recommended she pursue genetic testing of the custom gene panel. The Custom gene panel offered by [facility] and includes sequencing and rearrangement analysis for the following 40 genes: APC, ATM, AXIN2, BAP1, BARD1, BMPR1A, BRCA1, BRCA2, BRIP1, CDH1, CHEK2, EPCAM, FANCC, FH, FLCN, MET, MITF, MLH1, MSH2, MSH6, MUTYH, NBN, PALB2, PMS2, POLD1, POLE, PTEN, RAD51C, RAD51D, SCG5/GREM1, SDHB, SDHC, SDHD, SMAD4, STK11, TP53, TSC1, TSC2, VHL, and XRCC2. The report date is July 23, 2016. Genetic testing was normal, and did not reveal a deleterious mutation in these genes. The test report has been scanned into EPIC and is located under the Molecular Pathology section of the Results Review tab.   We discussed with Ms. Hochmuth that since the current genetic testing is not perfect, it is possible there may be a gene mutation in one of these genes that current testing cannot detect, but that chance is small. We also discussed, that it is possible that another gene that has not yet been discovered, or that we have not yet tested, is responsible for the cancer diagnoses in the family, and it is, therefore, important to remain in touch with cancer genetics in the future so that we can continue to offer Ms. Allor the most up to date genetic testing.   CANCER SCREENING RECOMMENDATIONS: This result is reassuring and indicates that Ms. Lull likely does not have an increased risk for a  future cancer due to a mutation in one of these genes. This normal test also suggests that Ms. Capelle's cancer was most likely not due to an inherited predisposition associated with one of these genes.  Most cancers happen by chance and this negative test suggests that her cancer falls into this category.  We, therefore, recommended she continue to follow the cancer management and screening guidelines  provided by her oncology and primary healthcare provider.   RECOMMENDATIONS FOR FAMILY MEMBERS: Women in this family might be at some increased risk of developing cancer, over the general population risk, simply due to the family history of cancer. We recommended women in this family have a yearly mammogram beginning at age 51, or 4 years younger than the earliest onset of cancer, an an annual clinical breast exam, and perform monthly breast self-exams. Women in this family should also have a gynecological exam as recommended by their primary provider. All family members should have a colonoscopy by age 56.  FOLLOW-UP: Lastly, we discussed with Ms. Menees that cancer genetics is a rapidly advancing field and it is possible that new genetic tests will be appropriate for her and/or her family members in the future. We encouraged her to remain in contact with cancer genetics on an annual basis so we can update her personal and family histories and let her know of advances in cancer genetics that may benefit this family.   Our contact number was provided. Ms. Placke questions were answered to her satisfaction, and she knows she is welcome to call us at anytime with additional questions or concerns.   Roma Kayser, MS, [facility] Certified Genetic Counselor [email or web address]

## 2016-07-27 NOTE — Telephone Encounter (Addendum)
Revealed negative genetic testing on a custom panel.  Discussed that this does not explain her cancer diagnosis or why her brother was diagnosed with kidney cancer.  There could be other genes that we did not test for, or there could be genetic changes that we are not able to pick up with current technology.  At this time, we are not able to identify a hereditary mutation that would explain the cancer in the family.

## 2016-09-15 ENCOUNTER — Telehealth: Payer: Self-pay | Admitting: *Deleted

## 2016-09-15 NOTE — Telephone Encounter (Signed)
S/w Dr Jana Hakim and called pt. Dr Jana Hakim said it would be prudent to see urologist.

## 2016-09-15 NOTE — Telephone Encounter (Signed)
"  I need Dr. Virgie Dad opinion if I need to see a urologist.  My brother died of cancer to his kidneys.  I had genetic testing which was negative but Dr. Barry Dienes ordered an [place] of my kidneys July 2017.  She now wants me to see a urologist.  Do not recall the name but scheduled November 3rd to see a Urologist.  Call me at work [phone] before 1:30 pm.   Call my mobile [phone] after I leave work."

## 2016-10-19 ENCOUNTER — Other Ambulatory Visit: Payer: Self-pay | Admitting: *Deleted

## 2016-10-19 DIAGNOSIS — D0511 Intraductal carcinoma in situ of right breast: Secondary | ICD-10-CM

## 2016-10-20 ENCOUNTER — Ambulatory Visit
Admission: RE | Admit: 2016-10-20 | Discharge: 2016-10-20 | Disposition: A | Discharge status: Home / Self Care | Payer: BLUE CROSS/BLUE SHIELD | Source: Ambulatory Visit | Attending: Oncology | Admitting: Oncology

## 2016-10-20 ENCOUNTER — Other Ambulatory Visit (HOSPITAL_BASED_OUTPATIENT_CLINIC_OR_DEPARTMENT_OTHER): Payer: BLUE CROSS/BLUE SHIELD

## 2016-10-20 ENCOUNTER — Ambulatory Visit (HOSPITAL_BASED_OUTPATIENT_CLINIC_OR_DEPARTMENT_OTHER): Payer: BLUE CROSS/BLUE SHIELD | Admitting: Oncology

## 2016-10-20 VITALS — BP 122/82 | HR 70 | Temp 98.4°F | Resp 18 | Ht 59.0 in | Wt 131.0 lb

## 2016-10-20 DIAGNOSIS — Z853 Personal history of malignant neoplasm of breast: Secondary | ICD-10-CM

## 2016-10-20 DIAGNOSIS — Z86000 Personal history of in-situ neoplasm of breast: Secondary | ICD-10-CM

## 2016-10-20 DIAGNOSIS — D0511 Intraductal carcinoma in situ of right breast: Secondary | ICD-10-CM

## 2016-10-20 DIAGNOSIS — R61 Generalized hyperhidrosis: Secondary | ICD-10-CM | POA: Diagnosis not present

## 2016-10-20 LAB — CBC WITH DIFFERENTIAL/PLATELET
BASO%: 0.2 % (ref 0.0–2.0)
Basophils Absolute: 0 10*3/uL (ref 0.0–0.1)
EOS%: 0.5 % (ref 0.0–7.0)
Eosinophils Absolute: 0 10*3/uL (ref 0.0–0.5)
HEMATOCRIT: 42.5 % (ref 34.8–46.6)
HGB: 13.9 g/dL (ref 11.6–15.9)
LYMPH#: 2.2 10*3/uL (ref 0.9–3.3)
LYMPH%: 32.8 % (ref 14.0–49.7)
MCH: 30.2 pg (ref 25.1–34.0)
MCHC: 32.7 g/dL (ref 31.5–36.0)
MCV: 92.4 fL (ref 79.5–101.0)
MONO#: 0.3 10*3/uL (ref 0.1–0.9)
MONO%: 3.8 % (ref 0.0–14.0)
NEUT%: 62.7 % (ref 38.4–76.8)
NEUTROS ABS: 4.2 10*3/uL (ref 1.5–6.5)
Platelets: 204 10*3/uL (ref 145–400)
RBC: 4.6 10*6/uL (ref 3.70–5.45)
RDW: 12.8 % (ref 11.2–14.5)
WBC: 6.6 10*3/uL (ref 3.9–10.3)

## 2016-10-20 LAB — COMPREHENSIVE METABOLIC PANEL
ALT: 19 U/L (ref 0–55)
AST: 17 U/L (ref 5–34)
Albumin: 3.8 g/dL (ref 3.5–5.0)
Alkaline Phosphatase: 56 U/L (ref 40–150)
Anion Gap: 9 mEq/L (ref 3–11)
BILIRUBIN TOTAL: 0.34 mg/dL (ref 0.20–1.20)
BUN: 13.4 mg/dL (ref 7.0–26.0)
CHLORIDE: 105 meq/L (ref 98–109)
CO2: 25 meq/L (ref 22–29)
CREATININE: 0.8 mg/dL (ref 0.6–1.1)
Calcium: 9.2 mg/dL (ref 8.4–10.4)
EGFR: 85 mL/min/{1.73_m2} — ABNORMAL LOW (ref >90)
Glucose: 119 mg/dl (ref 70–140)
Potassium: 4.3 mEq/L (ref 3.5–5.1)
Sodium: 138 mEq/L (ref 136–145)
TOTAL PROTEIN: 6.9 g/dL (ref 6.4–8.3)

## 2016-10-20 NOTE — Progress Notes (Signed)
Hematology and Oncology Follow Up Visit  Phyllis Hoffman [id number] 1971/04/25 45 y.o. 95[birth year]   PCP: Phyllis Reas, MD GYN: SU:  OTHER MD:  HISTORY OF BREAST CANCER: From the original intake note:  Phyllis Hoffman is a [place] woman, referred to [place] in September 2012 at the age of 60 for evaluation of right breast carcinoma.  Bilateral digital mammogram in [place] in August of 2011 showed a focus of microcalcification in the right breast and 6 month followup was recommended. This was performed in February 2012, the calcifications appearing stable as compared to prior study. The patient was recalled for repeat mammography in 6 months to resume her yearly schedule. On 07/29/2011, the calcifications were again noted, some of them appearing more irregular, however, and with previous studies. Left breast was unremarkable.  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 Tumor was ER and PR positive, both at 100%.  Bilateral breast MRIs were obtained 08/18/2011 showing a post biopsy hematoma in the medial right breast. There was a vague patchy no masslike enhancement in the medial right breast near the biopsy cavity, spanning approximately 2.4 cm. No evidence of multifocal or multicentric disease in the right breast. Left breast and lymph nodes appeared benign.  Patient did underwent reexcision of the right medial margin but subsequent clear margins. 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  Patient underwent radiation therapy in [place],completed December 2012.At that time she started tamoxifen. Of note patient is known to be BRCA1 and 2 negative.  Her subsequent history is as detailed below  Interim History:   Phyllis Hoffman returns today for follow-up of her ductal carcinoma in situ. She continues on tamoxifen, with  good tolerance. She does have some night sweats, but no significant hot flashes. Vaginal discharge is not a major issue. She obtains a drug at a good price.  Review of Systems: She exercises regularly at the gym. A detailed review of systems today was noncontributory except as noted  Family History: Patient's father had prostate cancer diagnosed proximally12 years ago, had surgery, and "is fine". The patient has one brother who has developed renal cell carcinoma.  no sisters. No breast or ovarian cancer in the family to the best of her knowledge. The patient has been genetically tested as noted below  Gynecologic History: First menstrual period at age 70, still having regular periods. Patient is [place] P2,  first pregnancy to term at age 70. She did use birth control pills for approximately 17 years. She continues to menstruate regularly.  Social History: Patient works in a [employer]. Her husband Phyllis Hoffman "Phyllis Hoffman, trains show horses and does quite a bit of traveling. They have 2 children, 1 son, Phyllis Hoffman, age 27 and one daughter, Phyllis Hoffman, age 55. The patient attends the [employer] in [place].  Medications: I have reviewed the patient's current medications. Current Outpatient Prescriptions  Medication Sig Dispense Refill  . acidophilus (RISAQUAD) CAPS Take by mouth daily.      . Ascorbic Acid (VITAMIN C) 1000 MG tablet Take 1,000 mg by mouth 3 (three) times daily.      . fish oil-omega-3 fatty acids 1000 MG capsule Take 1 g by mouth daily.     Marland Kitchen glucosamine-chondroitin 500-400 MG tablet Take 1 tablet by mouth 3 (three) times daily.      . Multiple Vitamin (MULTIVITAMIN) tablet Take 1 tablet by mouth daily.      Marland Kitchen  OVER THE COUNTER MEDICATION Take 1 tablet by mouth 3 (three) times daily. Immune system builder     . tamoxifen (NOLVADEX) 20 MG tablet Take 1 tablet (20 mg total) by mouth daily. 90 tablet 1   No current facility-administered medications for this visit.      Allergies:  Allergies  Allergen Reactions  . Erythromycin Nausea And Vomiting  . Penicillins Other (See Comments)    Happened when she was a child, does not remember reaction.      Vitals:   10/20/16 1155  BP: 122/82  Pulse: 70  Resp: 18  Temp: 98.4 F (36.9 C)   Sclerae unicteric, EOMs intact Oropharynx clear and moist No cervical or supraclavicular adenopathy Lungs no rales or rhonchi Heart regular rate and rhythm Abd soft, nontender, positive bowel sounds MSK no focal spinal tenderness, no upper extremity lymphedema Neuro: nonfocal, well oriented, appropriate affect Breasts: The right breast is status post lumpectomy and radiation. There is no evidence of local recurrence. The left breast is unremarkable both axillae are benign.  Lab Results: Lab Results  Component Value Date   WBC 6.6 10/20/2016   HGB 13.9 10/20/2016   HCT 42.5 10/20/2016   MCV 92.4 10/20/2016   PLT 204 10/20/2016   NEUTROABS 4.2 10/20/2016     Chemistry      Component Value Date/Time   NA 138 10/20/2016 1122   K 4.3 10/20/2016 1122   CL 104 12/29/2011 1002   CO2 25 10/20/2016 1122   BUN 13.4 10/20/2016 1122   CREATININE 0.8 10/20/2016 1122      Component Value Date/Time   CALCIUM 9.2 10/20/2016 1122   ALKPHOS 56 10/20/2016 1122   AST 17 10/20/2016 1122   ALT 19 10/20/2016 1122   BILITOT 0.34 10/20/2016 1122        Radiological Studies: Mm Diag Breast Tomo Bilateral  Result Date: 10/20/2016 CLINICAL DATA:  History of treated right breast cancer, status post lumpectomy in 2012. EXAM: 2D DIGITAL DIAGNOSTIC BILATERAL MAMMOGRAM WITH CAD AND ADJUNCT TOMO COMPARISON:  Previous exam(s). ACR Breast Density Category c: The breast tissue is heterogeneously dense, which may obscure small masses. FINDINGS: Mammographically, there are no suspicious masses, areas of nonsurgical architectural distortion or microcalcifications in either breast. The lumpectomy site in the medial right breast has  stable appearance. Mammographic images were processed with CAD. IMPRESSION: No mammographic evidence of malignancy in either breast, status post right lumpectomy. RECOMMENDATION: Diagnostic mammogram is suggested in 1 year. (Code:[id number]-B-01Y) I have discussed the findings and recommendations with the patient. Results were also provided in writing at the conclusion of the visit. If applicable, a reminder letter will be sent to the patient regarding the next appointment. BI-RADS CATEGORY  2: Benign. Electronically Signed   By: Fidela Salisbury M.D.   On: 10/20/2016 10:14      Assessment:  45 y.o. BRCA negative [place], [place] woman,   (1) status post right breast biopsy on 08/11/2011 for ductal carcinoma in situ, intermediate to high grade, strongly estrogen receptor and progesterone positive, with  subsequent reexcision for margin clearance October 2012.;  (2) s/p radiation therapy delivered in  [place] completed December 2012  (3) started tamoxifen December 2012, completing 5 years November 2017  (4) breast density category C  (5) updated genetic testing 07/23/2016 through the Custom gene panel offered by [facility] found no deleterious mutations in APC, ATM, AXIN2, BAP1, BARD1, BMPR1A, BRCA1, BRCA2, BRIP1, CDH1, CHEK2, EPCAM, FANCC, FH, FLCN, MET, MITF, MLH1, MSH2, MSH6, MUTYH,  NBN, PALB2, PMS2, POLD1, POLE, PTEN, RAD51C, RAD51D, SCG5/GREM1, SDHB, SDHC, SDHD, SMAD4, STK11, TP53, TSC1, TSC2, VHL, and XRCC2  Plan:  Florencia has completed 5 years of anti-estrogen therapy in the setting of noninvasive breast cancer.  She has an excellent prognosis and we are ready to discontinue follow-up here.  We discussed the fact that for noninvasive breast cancer we do not have data to continuing anti-estrogens because of 5 years.   Accordingly I am comfortable releasing her to her primary care physician. All she will need an Breast Cancer Screening Is Her Yearly Mammography and a Yearly Physician Breast  Exam.  I will be glad to see Jadee again at any point in the future if on when the need arises, but as of now we're making no further routine appointment for her here.  Chauncey Cruel, MD  10/23/2016

## 2016-12-30 ENCOUNTER — Telehealth: Payer: Self-pay | Admitting: *Deleted
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
## 2017-01-04 NOTE — Telephone Encounter (Signed)
This RN spoke with pt per her concerns- discussed changes in body habitus secondary to being off the tamoxifen-  Discussed interventions as well as validated her concerns.  Teaching of signs of cancer concern discussed.  Pt will call this RN in 2 weeks with update regarding how she is feeling.

## 2017-04-26 ENCOUNTER — Encounter (HOSPITAL_COMMUNITY): Payer: Self-pay

## 2017-09-14 ENCOUNTER — Other Ambulatory Visit: Payer: Self-pay | Admitting: Oncology

## 2017-09-14 DIAGNOSIS — Z9889 Other specified postprocedural states: Secondary | ICD-10-CM

## 2017-10-18 ENCOUNTER — Other Ambulatory Visit: Payer: Self-pay | Admitting: Oncology

## 2017-10-18 ENCOUNTER — Other Ambulatory Visit: Payer: Self-pay

## 2017-10-18 DIAGNOSIS — Z9889 Other specified postprocedural states: Secondary | ICD-10-CM

## 2017-10-24 ENCOUNTER — Ambulatory Visit
Admission: RE | Admit: 2017-10-24 | Discharge: 2017-10-24 | Disposition: A | Discharge status: Home / Self Care | Payer: PRIVATE HEALTH INSURANCE | Source: Ambulatory Visit | Attending: Oncology | Admitting: Oncology

## 2017-10-24 DIAGNOSIS — Z9889 Other specified postprocedural states: Secondary | ICD-10-CM

## 2017-10-24 HISTORY — DX: Personal history of irradiation: Z92.3

## 2018-09-19 ENCOUNTER — Other Ambulatory Visit: Payer: Self-pay | Admitting: Specialist

## 2018-09-19 DIAGNOSIS — Z1231 Encounter for screening mammogram for malignant neoplasm of breast: Secondary | ICD-10-CM

## 2018-10-25 ENCOUNTER — Encounter (INDEPENDENT_AMBULATORY_CARE_PROVIDER_SITE_OTHER): Payer: Self-pay

## 2018-10-25 ENCOUNTER — Ambulatory Visit
Admission: RE | Admit: 2018-10-25 | Discharge: 2018-10-25 | Disposition: A | Discharge status: Home / Self Care | Payer: PRIVATE HEALTH INSURANCE | Source: Ambulatory Visit | Attending: Specialist | Admitting: Specialist

## 2018-10-25 DIAGNOSIS — Z1231 Encounter for screening mammogram for malignant neoplasm of breast: Secondary | ICD-10-CM

## 2019-09-25 ENCOUNTER — Other Ambulatory Visit: Payer: Self-pay | Admitting: Specialist

## 2019-09-25 DIAGNOSIS — Z1231 Encounter for screening mammogram for malignant neoplasm of breast: Secondary | ICD-10-CM

## 2019-11-14 ENCOUNTER — Ambulatory Visit
Admission: RE | Admit: 2019-11-14 | Discharge: 2019-11-14 | Disposition: A | Discharge status: Home / Self Care | Payer: PRIVATE HEALTH INSURANCE | Source: Ambulatory Visit | Attending: Specialist | Admitting: Specialist

## 2019-11-14 ENCOUNTER — Other Ambulatory Visit: Payer: Self-pay

## 2019-11-14 DIAGNOSIS — Z1231 Encounter for screening mammogram for malignant neoplasm of breast: Secondary | ICD-10-CM

## 2019-11-15 ENCOUNTER — Other Ambulatory Visit: Payer: Self-pay | Admitting: Specialist

## 2019-11-15 DIAGNOSIS — R928 Other abnormal and inconclusive findings on diagnostic imaging of breast: Secondary | ICD-10-CM

## 2019-11-16 ENCOUNTER — Other Ambulatory Visit: Payer: Self-pay | Admitting: Family Medicine

## 2019-11-27 ENCOUNTER — Other Ambulatory Visit: Payer: Self-pay

## 2019-11-27 ENCOUNTER — Ambulatory Visit
Admission: RE | Admit: 2019-11-27 | Discharge: 2019-11-27 | Disposition: A | Discharge status: Home / Self Care | Payer: PRIVATE HEALTH INSURANCE | Source: Ambulatory Visit | Attending: Specialist | Admitting: Specialist

## 2019-11-27 DIAGNOSIS — R928 Other abnormal and inconclusive findings on diagnostic imaging of breast: Secondary | ICD-10-CM

## 2020-10-17 ENCOUNTER — Other Ambulatory Visit: Payer: Self-pay | Admitting: Specialist

## 2020-10-17 DIAGNOSIS — Z1231 Encounter for screening mammogram for malignant neoplasm of breast: Secondary | ICD-10-CM

## 2020-12-03 ENCOUNTER — Ambulatory Visit
Admission: RE | Admit: 2020-12-03 | Discharge: 2020-12-03 | Disposition: A | Discharge status: Home / Self Care | Payer: No Typology Code available for payment source | Source: Ambulatory Visit | Attending: Specialist | Admitting: Specialist

## 2020-12-03 ENCOUNTER — Ambulatory Visit: Payer: PRIVATE HEALTH INSURANCE

## 2020-12-03 ENCOUNTER — Other Ambulatory Visit: Payer: Self-pay

## 2020-12-03 DIAGNOSIS — Z1231 Encounter for screening mammogram for malignant neoplasm of breast: Secondary | ICD-10-CM

## 2021-11-03 ENCOUNTER — Other Ambulatory Visit: Payer: Self-pay | Admitting: Specialist

## 2021-11-03 DIAGNOSIS — Z1231 Encounter for screening mammogram for malignant neoplasm of breast: Secondary | ICD-10-CM

## 2021-12-10 ENCOUNTER — Ambulatory Visit
Admission: RE | Admit: 2021-12-10 | Discharge: 2021-12-10 | Disposition: A | Discharge status: Home / Self Care | Payer: No Typology Code available for payment source | Source: Ambulatory Visit | Attending: Specialist | Admitting: Specialist

## 2021-12-10 DIAGNOSIS — Z1231 Encounter for screening mammogram for malignant neoplasm of breast: Secondary | ICD-10-CM

## 2022-11-12 ENCOUNTER — Other Ambulatory Visit: Payer: Self-pay | Admitting: Specialist

## 2022-11-12 DIAGNOSIS — Z1231 Encounter for screening mammogram for malignant neoplasm of breast: Secondary | ICD-10-CM

## 2023-01-06 ENCOUNTER — Ambulatory Visit
Admission: RE | Admit: 2023-01-06 | Discharge: 2023-01-06 | Disposition: A | Discharge status: Home / Self Care | Payer: No Typology Code available for payment source | Source: Ambulatory Visit | Attending: Specialist | Admitting: Specialist

## 2023-01-06 DIAGNOSIS — Z1231 Encounter for screening mammogram for malignant neoplasm of breast: Secondary | ICD-10-CM

## 2023-10-01 IMAGING — MG MM DIGITAL SCREENING BILAT W/ TOMO AND CAD
8 series · 8 of 24 positions shown · non-contrast
Comparison: Previous exam(s).

CLINICAL DATA: Screening.

EXAM:
DIGITAL SCREENING BILATERAL MAMMOGRAM WITH TOMOSYNTHESIS AND CAD
TECHNIQUE: Bilateral screening digital craniocaudal and mediolateral oblique
mammograms were obtained. Bilateral screening digital breast
tomosynthesis was performed. The images were evaluated with
computer-aided detection.

[R CC synth-2D]
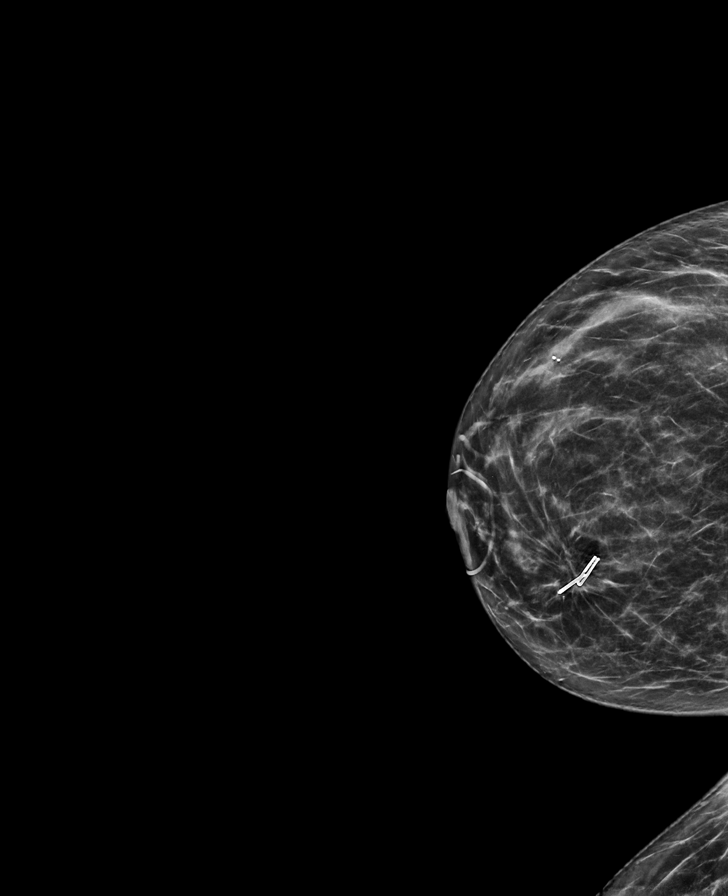

[L CC synth-2D]
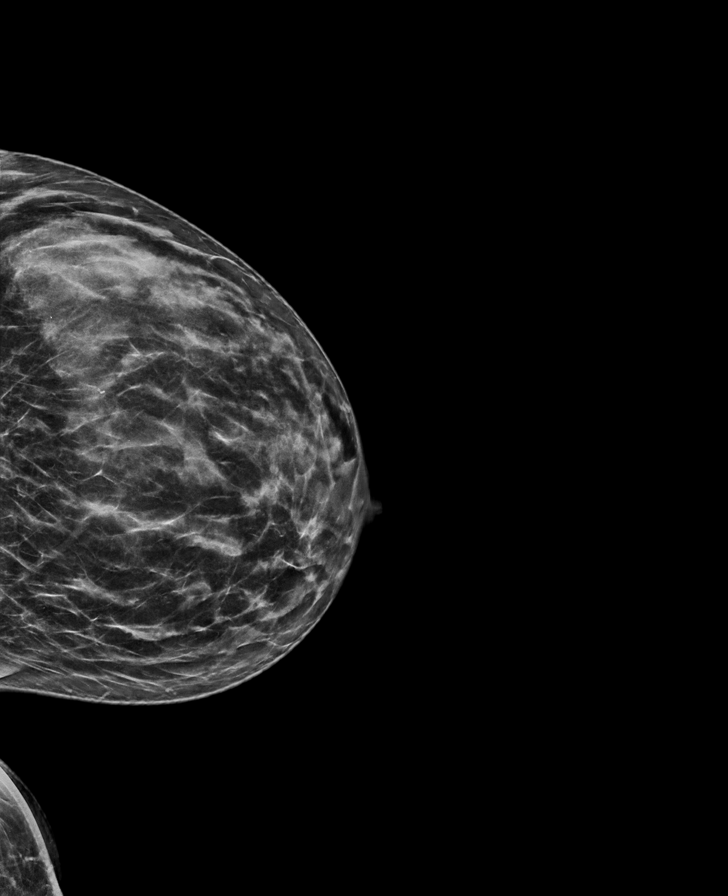

[L MLO synth-2D]
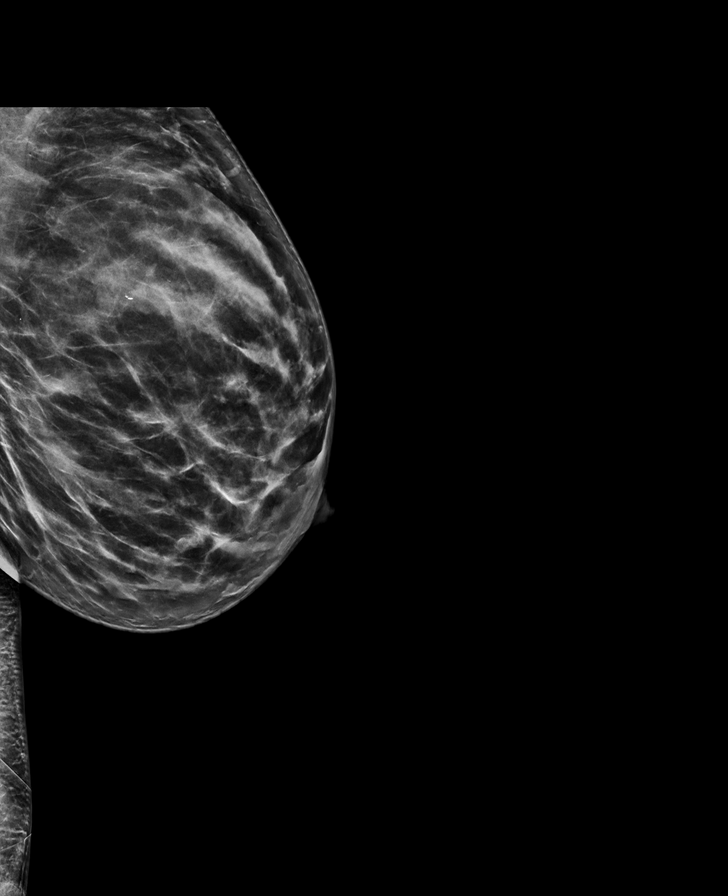

[R MLO synth-2D]
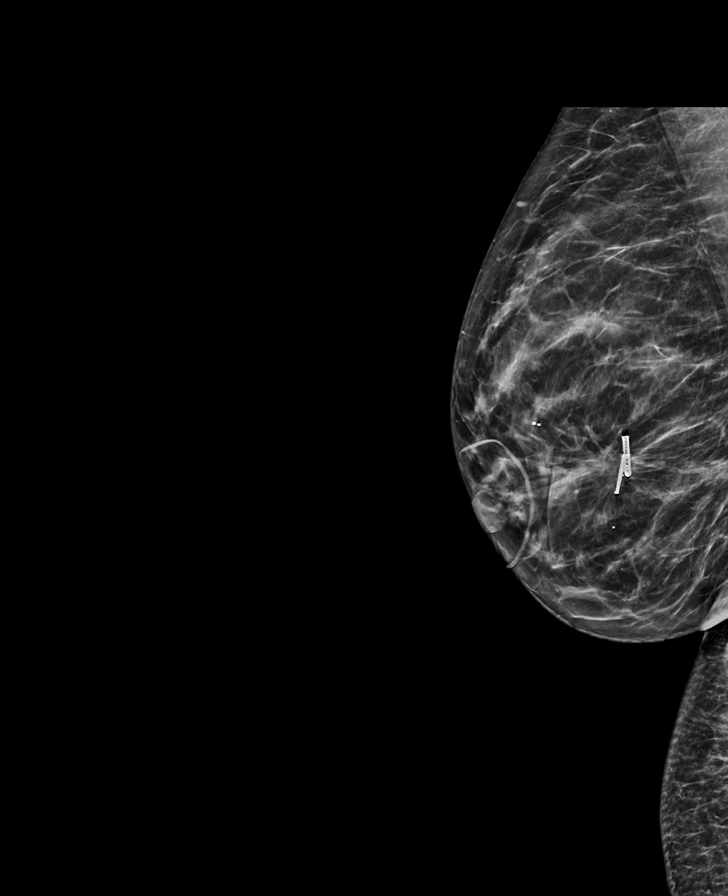

[R MLO tomo · tomo slice 32/63.0]
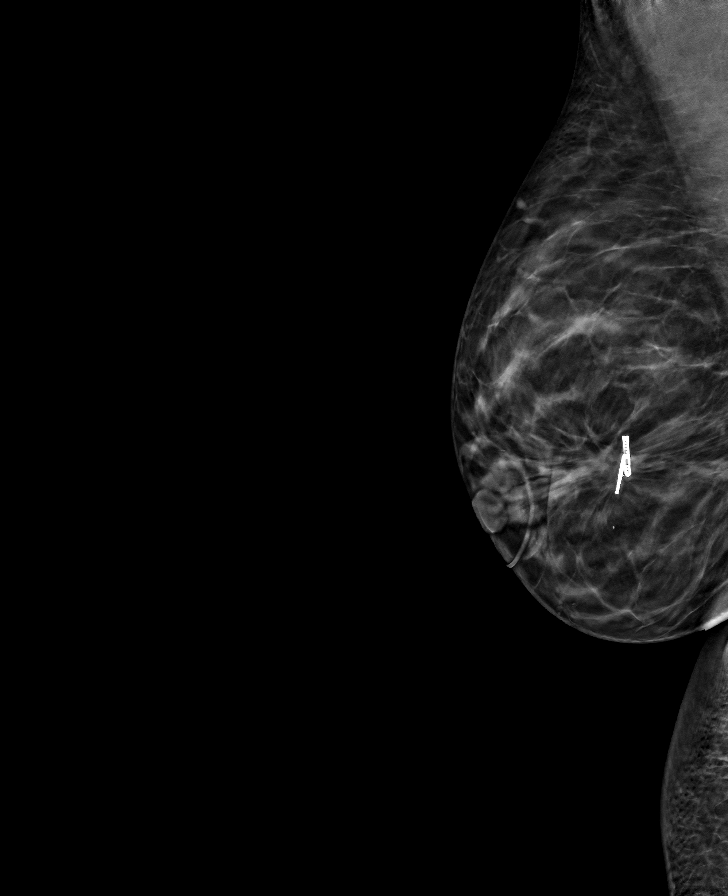

[L MLO tomo · tomo slice 35/69.0]
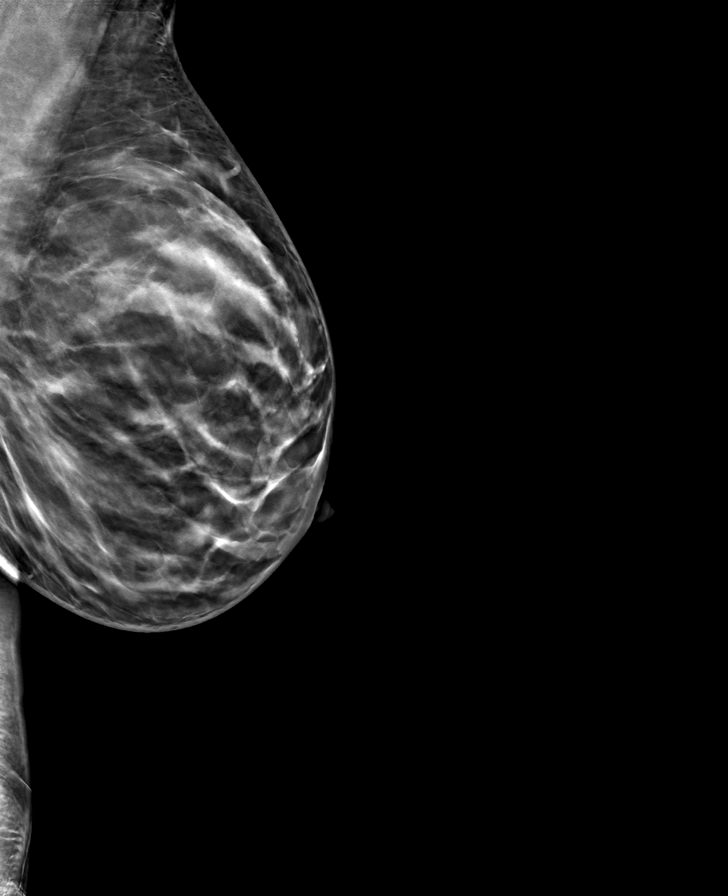

[L CC tomo · tomo slice 33/65.0]
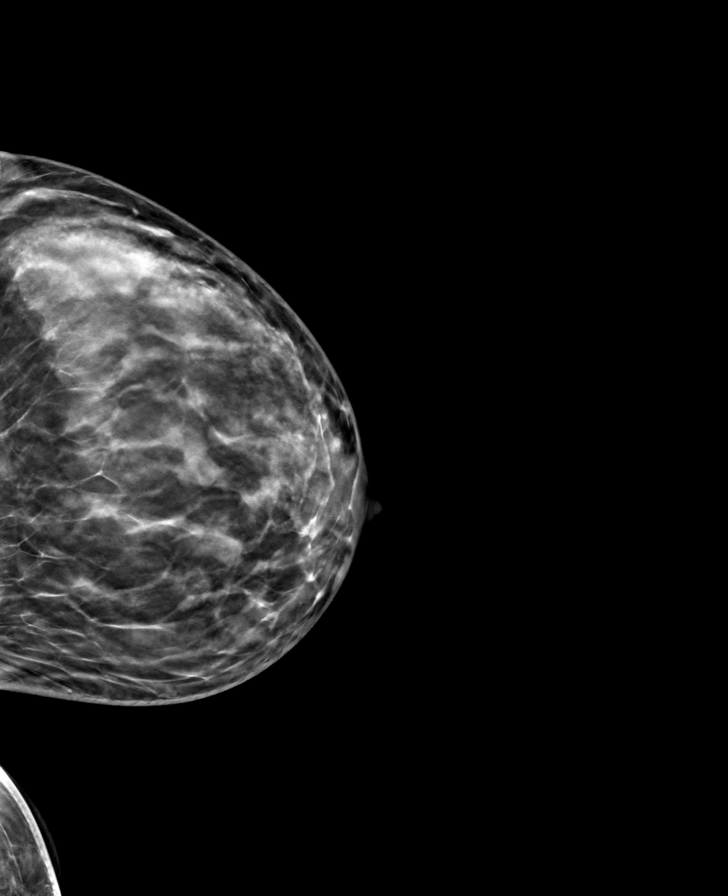

[R CC tomo · tomo slice 31/60.0]
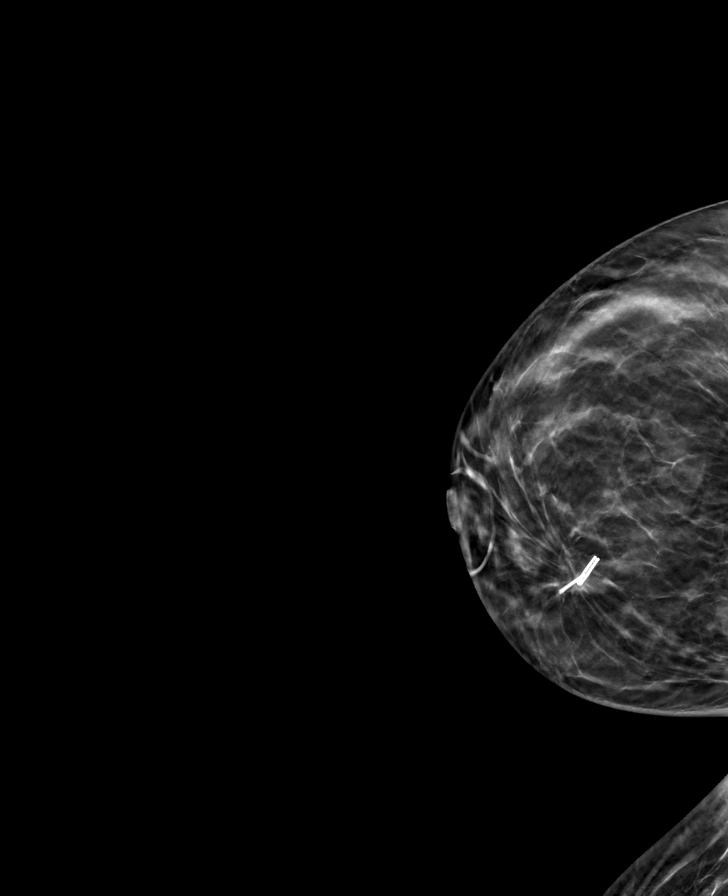

[8 of 24 positions shown; findings below may reference images not displayed]

ACR Breast Density Category c: The breast tissue is heterogeneously
dense, which may obscure small masses.
FINDINGS: There are no findings suspicious for malignancy.
IMPRESSION: No mammographic evidence of malignancy. A result letter of this
screening mammogram will be mailed directly to the patient.

RECOMMENDATION:
Screening mammogram in one year. (Code:Q3-W-BC3)

BI-RADS CATEGORY  1: Negative.

## 2023-11-28 ENCOUNTER — Other Ambulatory Visit: Payer: Self-pay | Admitting: Specialist

## 2023-11-28 DIAGNOSIS — Z1231 Encounter for screening mammogram for malignant neoplasm of breast: Secondary | ICD-10-CM

## 2024-01-09 ENCOUNTER — Ambulatory Visit
Admission: RE | Admit: 2024-01-09 | Discharge: 2024-01-09 | Disposition: A | Discharge status: Home / Self Care | Payer: No Typology Code available for payment source | Source: Ambulatory Visit | Attending: Specialist | Admitting: Specialist

## 2024-01-09 DIAGNOSIS — Z1231 Encounter for screening mammogram for malignant neoplasm of breast: Secondary | ICD-10-CM

## 2024-12-03 ENCOUNTER — Other Ambulatory Visit: Payer: Self-pay | Admitting: Specialist

## 2024-12-03 DIAGNOSIS — Z1231 Encounter for screening mammogram for malignant neoplasm of breast: Secondary | ICD-10-CM

## 2025-01-03 ENCOUNTER — Other Ambulatory Visit: Payer: Self-pay | Admitting: Medical Genetics

## 2025-01-09 ENCOUNTER — Ambulatory Visit: Payer: Self-pay
